# Patient Record
Sex: Female | Born: 1975 | Race: Black or African American | Hispanic: No | Smoking: Never smoker
Health system: Southern US, Community
[De-identification: ages and names within clinical notes are randomized; demographics above are authoritative.]

## PROBLEM LIST (undated history)

## (undated) DIAGNOSIS — D259 Leiomyoma of uterus, unspecified: Secondary | ICD-10-CM

## (undated) DIAGNOSIS — Z789 Other specified health status: Secondary | ICD-10-CM

## (undated) DIAGNOSIS — R011 Cardiac murmur, unspecified: Secondary | ICD-10-CM

## (undated) HISTORY — DX: Cardiac murmur, unspecified: R01.1

---

## 1998-05-06 HISTORY — PX: KNEE RECONSTRUCTION: SHX5883

## 2003-12-22 ENCOUNTER — Emergency Department (HOSPITAL_COMMUNITY): Admission: EM | Admit: 2003-12-22 | Discharge: 2003-12-22 | Payer: Self-pay | Admitting: Emergency Medicine

## 2005-05-15 ENCOUNTER — Inpatient Hospital Stay (HOSPITAL_COMMUNITY): Admission: AD | Admit: 2005-05-15 | Discharge: 2005-05-15 | Payer: Self-pay | Admitting: Obstetrics and Gynecology

## 2005-05-19 ENCOUNTER — Emergency Department (HOSPITAL_COMMUNITY): Admission: EM | Admit: 2005-05-19 | Discharge: 2005-05-19 | Payer: Self-pay | Admitting: Emergency Medicine

## 2005-05-21 ENCOUNTER — Emergency Department (HOSPITAL_COMMUNITY): Admission: EM | Admit: 2005-05-21 | Discharge: 2005-05-21 | Payer: Self-pay | Admitting: Family Medicine

## 2005-05-23 ENCOUNTER — Encounter: Admission: RE | Admit: 2005-05-23 | Discharge: 2005-05-23 | Payer: Self-pay | Admitting: Cardiovascular Disease

## 2013-04-19 ENCOUNTER — Inpatient Hospital Stay (HOSPITAL_COMMUNITY)
Admission: AD | Admit: 2013-04-19 | Discharge: 2013-04-19 | Disposition: A | Discharge status: Home / Self Care | Payer: Self-pay | Source: Ambulatory Visit | Attending: Obstetrics and Gynecology | Admitting: Obstetrics and Gynecology

## 2013-04-19 ENCOUNTER — Inpatient Hospital Stay (HOSPITAL_COMMUNITY): Payer: Self-pay

## 2013-04-19 ENCOUNTER — Encounter (HOSPITAL_COMMUNITY): Payer: Self-pay | Admitting: *Deleted

## 2013-04-19 DIAGNOSIS — R109 Unspecified abdominal pain: Secondary | ICD-10-CM | POA: Insufficient documentation

## 2013-04-19 DIAGNOSIS — D219 Benign neoplasm of connective and other soft tissue, unspecified: Secondary | ICD-10-CM

## 2013-04-19 DIAGNOSIS — D259 Leiomyoma of uterus, unspecified: Secondary | ICD-10-CM | POA: Insufficient documentation

## 2013-04-19 DIAGNOSIS — N7011 Chronic salpingitis: Secondary | ICD-10-CM

## 2013-04-19 DIAGNOSIS — N7013 Chronic salpingitis and oophoritis: Secondary | ICD-10-CM | POA: Insufficient documentation

## 2013-04-19 HISTORY — DX: Leiomyoma of uterus, unspecified: D25.9

## 2013-04-19 LAB — URINALYSIS, ROUTINE W REFLEX MICROSCOPIC
Bilirubin Urine: NEGATIVE
Glucose, UA: NEGATIVE mg/dL
Ketones, ur: NEGATIVE mg/dL
Leukocytes, UA: NEGATIVE
Nitrite: NEGATIVE
Protein, ur: NEGATIVE mg/dL
Specific Gravity, Urine: >1.03 — ABNORMAL HIGH (ref 1.005–1.030)
Urobilinogen, UA: 0.2 mg/dL (ref 0.0–1.0)
pH: 6 (ref 5.0–8.0)

## 2013-04-19 LAB — WET PREP, GENITAL: Yeast Wet Prep HPF POC: NONE SEEN

## 2013-04-19 LAB — URINE MICROSCOPIC-ADD ON

## 2013-04-19 MED ORDER — DOXYCYCLINE HYCLATE 50 MG PO CAPS: 100.0000 mg | ORAL_CAPSULE | Freq: Two times a day (BID) | ORAL | Status: DC

## 2013-04-19 MED ORDER — HYDROMORPHONE HCL PF 1 MG/ML IJ SOLN
1.0000 mg | Freq: Once | INTRAMUSCULAR | Status: AC
  Administered 2013-04-19: 1 mg via INTRAMUSCULAR
  Filled 2013-04-19: qty 1

## 2013-04-19 MED ORDER — ONDANSETRON 8 MG PO TBDP
8.0000 mg | ORAL_TABLET | Freq: Once | ORAL | Status: AC
  Administered 2013-04-19: 8 mg via ORAL
  Filled 2013-04-19: qty 1

## 2013-04-19 MED ORDER — DOXYCYCLINE HYCLATE 100 MG PO TABS
100.0000 mg | ORAL_TABLET | Freq: Once | ORAL | Status: AC
  Administered 2013-04-19: 100 mg via ORAL
  Filled 2013-04-19: qty 1

## 2013-04-19 MED ORDER — PROMETHAZINE HCL 25 MG/ML IJ SOLN
12.5000 mg | Freq: Once | INTRAMUSCULAR | Status: AC
  Administered 2013-04-19: 12.5 mg via INTRAMUSCULAR
  Filled 2013-04-19: qty 1

## 2013-04-19 MED ORDER — CEFTRIAXONE SODIUM 250 MG IJ SOLR
250.0000 mg | Freq: Once | INTRAMUSCULAR | Status: AC
  Administered 2013-04-19: 250 mg via INTRAMUSCULAR
  Filled 2013-04-19: qty 250

## 2013-04-19 MED ORDER — KETOROLAC TROMETHAMINE 60 MG/2ML IM SOLN
60.0000 mg | Freq: Once | INTRAMUSCULAR | Status: AC
  Administered 2013-04-19: 60 mg via INTRAMUSCULAR
  Filled 2013-04-19: qty 2

## 2013-04-19 NOTE — MAU Provider Note (Signed)
History     CSN: [id number]  Arrival date and time: 04/19/13 8295   None     No chief complaint on file.  HPI  Karla Wolf is a 37 y.o. who presents today with spotting and cramping. She states that she has been spotting for one week, and cramping since last night. She states that she normally has very bad cramps when she has her period, but this is much worse than normal. She rates her pain 10/10. She has tried Midol, ibuprofen  and tylenol without any improvement.   She has been a longtime patient of Dr. Cherly Hensen, but has not been to see her in about a year and a half.   No past medical history on file.  No past surgical history on file.  No family history on file.  History  Substance Use Topics  . Smoking status: Not on file  . Smokeless tobacco: Not on file  . Alcohol Use: Not on file    Allergies: Allergies not on file  No prescriptions prior to admission    ROS Physical Exam   Height 5\' 6"  (1.676 m), weight 76.204 kg (168 lb), last menstrual period 04/12/2013.  Physical Exam  Nursing note and vitals reviewed. Constitutional: She is oriented to person, place, and time. She appears well-developed and well-nourished. No distress.  Cardiovascular: Normal rate.   Respiratory: Effort normal.  GI: Soft. There is no tenderness.  Genitourinary:   External: no lesion Vagina: small amount of white discharge Cervix: pink, smooth, no CMT Uterus: slightly enlarged, Retroverted  Adnexa: some tenderness on the left     Neurological: She is alert and oriented to person, place, and time.  Skin: Skin is warm and dry.  Psychiatric: She has a normal mood and affect.    MAU Course  Procedures  Results for orders placed during the hospital encounter of 04/19/13 (from the past 24 hour(s))  URINALYSIS, ROUTINE W REFLEX MICROSCOPIC     Status: Abnormal   Collection Time    04/19/13  3:30 AM      Result Value Range   Color, Urine YELLOW  YELLOW   APPearance CLEAR   CLEAR   Specific Gravity, Urine >1.030 (*) 1.005 - 1.030   pH 6.0  5.0 - 8.0   Glucose, UA NEGATIVE  NEGATIVE mg/dL   Hgb urine dipstick SMALL (*) NEGATIVE   Bilirubin Urine NEGATIVE  NEGATIVE   Ketones, ur NEGATIVE  NEGATIVE mg/dL   Protein, ur NEGATIVE  NEGATIVE mg/dL   Urobilinogen, UA 0.2  0.0 - 1.0 mg/dL   Nitrite NEGATIVE  NEGATIVE   Leukocytes, UA NEGATIVE  NEGATIVE  URINE MICROSCOPIC-ADD ON     Status: Abnormal   Collection Time    04/19/13  3:30 AM      Result Value Range   Squamous Epithelial / LPF FEW (*) RARE   WBC, UA 0-2  <3 WBC/hpf   RBC / HPF 0-2  <3 RBC/hpf   Bacteria, UA FEW (*) RARE   Urine-Other MUCOUS PRESENT    WET PREP, GENITAL     Status: Abnormal   Collection Time    04/19/13  3:50 AM      Result Value Range   Yeast Wet Prep HPF POC NONE SEEN  NONE SEEN   Trich, Wet Prep NONE SEEN  NONE SEEN   Clue Cells Wet Prep HPF POC FEW (*) NONE SEEN   WBC, Wet Prep HPF POC FEW (*) NONE SEEN    US Transvaginal  Non-ob  04/19/2013   CLINICAL DATA:  Pelvic pain.  EXAM: TRANSABDOMINAL AND TRANSVAGINAL ULTRASOUND OF PELVIS  TECHNIQUE: Both transabdominal and transvaginal ultrasound examinations of the pelvis were performed. Transabdominal technique was performed for global imaging of the pelvis including uterus, ovaries, adnexal regions, and pelvic cul-de-sac. It was necessary to proceed with endovaginal exam following the transabdominal exam to visualize the uterus.  COMPARISON:  Pelvic sonogram 05/15/2005  FINDINGS: Uterus  The uterus is enlarged a 12 x 8 x 11 cm secondary to multiple, at least 7, heterogeneous echotexture, partly shadowing masses, consistent with fibroids. The largest fibroid is posterior and to the right, intramural, measuring 7 cm in diameter. The remaining intramural fibroids measure 3 to 5 cm in diameter. The most notable fibroid is submucosal, with extensive contact to the endometrium, approximately 3 cm in diameter.  Endometrium  Thickness: Distorted  in the mid body secondary to above submucosal fibroid. Otherwise, endometrium is thin at 6.5 mm.  Right ovary  Measurements: 2.9 x 1.7 x 1.9 cm. The ovary appears normal. There is however a 4.5 cm long, 1.8 cm diameter anechoic tubular structure in the adnexae which may represent a hydrosalpinx. No confirmatory endosalpingeal folds.  Left ovary  Measurements: 3.6 x 1.7 x 1.8 cm. Normal appearance/no adnexal mass.  Other findings  Small free fluid, simple in appearance.  IMPRESSION: 1. Numerous uterine fibroids, up to 7 cm in diameter. Notable 3 cm fibroid which is submucosal. 2. Right hydrosalpinx versus peritoneal inclusion cyst.   Electronically Signed   By: Tiburcio Pea M.D.   On: 04/19/2013 05:10   US Pelvis Complete  04/19/2013   CLINICAL DATA:  Pelvic pain.  EXAM: TRANSABDOMINAL AND TRANSVAGINAL ULTRASOUND OF PELVIS  TECHNIQUE: Both transabdominal and transvaginal ultrasound examinations of the pelvis were performed. Transabdominal technique was performed for global imaging of the pelvis including uterus, ovaries, adnexal regions, and pelvic cul-de-sac. It was necessary to proceed with endovaginal exam following the transabdominal exam to visualize the uterus.  COMPARISON:  Pelvic sonogram 05/15/2005  FINDINGS: Uterus  The uterus is enlarged a 12 x 8 x 11 cm secondary to multiple, at least 7, heterogeneous echotexture, partly shadowing masses, consistent with fibroids. The largest fibroid is posterior and to the right, intramural, measuring 7 cm in diameter. The remaining intramural fibroids measure 3 to 5 cm in diameter. The most notable fibroid is submucosal, with extensive contact to the endometrium, approximately 3 cm in diameter.  Endometrium  Thickness: Distorted in the mid body secondary to above submucosal fibroid. Otherwise, endometrium is thin at 6.5 mm.  Right ovary  Measurements: 2.9 x 1.7 x 1.9 cm. The ovary appears normal. There is however a 4.5 cm long, 1.8 cm diameter anechoic tubular  structure in the adnexae which may represent a hydrosalpinx. No confirmatory endosalpingeal folds.  Left ovary  Measurements: 3.6 x 1.7 x 1.8 cm. Normal appearance/no adnexal mass.  Other findings  Small free fluid, simple in appearance.  IMPRESSION: 1. Numerous uterine fibroids, up to 7 cm in diameter. Notable 3 cm fibroid which is submucosal. 2. Right hydrosalpinx versus peritoneal inclusion cyst.   Electronically Signed   By: Tiburcio Pea M.D.   On: 04/19/2013 05:10     Assessment and Plan   1. Fibroids   2. Hydrosalpinx     First dose of doxy given here in MAU    Medication List         doxycycline 50 MG capsule  Commonly known as:  VIBRAMYCIN  Take  2 capsules (100 mg total) by mouth 2 (two) times daily.     ibuprofen 400 MG tablet  Commonly known as:  ADVIL,MOTRIN  Take 400 mg by mouth every 6 (six) hours as needed.       Follow-up Information   Schedule an appointment as soon as possible for a visit with Serita Kyle, MD.   Specialty:  Obstetrics and Gynecology   Contact information:   [address] [place] [address] [phone]       Follow up with COUSINS,SHERONETTE A, MD In 2 weeks.   Specialty:  Obstetrics and Gynecology   Contact information:   [address] [place] [address] [phone]        Tawnya Crook 04/19/2013, 3:32 AM

## 2013-04-19 NOTE — MAU Note (Signed)
Patient complains of severe abdominal cramping that has been going on for a while but never this bad. Spotting that began 12/8.

## 2013-04-23 NOTE — MAU Provider Note (Signed)
Attestation of Attending Supervision of Advanced Practitioner: Evaluation and management procedures were performed by the PA/NP/CNM/OB Fellow under my supervision/collaboration. Chart reviewed and agree with management and plan.  Norena Bratton V 04/23/2013 8:00 PM   

## 2013-05-27 ENCOUNTER — Other Ambulatory Visit: Payer: Self-pay | Admitting: Obstetrics and Gynecology

## 2013-05-27 ENCOUNTER — Encounter (HOSPITAL_COMMUNITY): Payer: Self-pay | Admitting: Pharmacist

## 2013-05-31 ENCOUNTER — Encounter (HOSPITAL_COMMUNITY): Payer: Self-pay

## 2013-05-31 ENCOUNTER — Encounter (HOSPITAL_COMMUNITY)
Admission: RE | Admit: 2013-05-31 | Discharge: 2013-05-31 | Disposition: A | Discharge status: Home / Self Care | Payer: Managed Care, Other (non HMO) | Source: Ambulatory Visit | Attending: Obstetrics and Gynecology | Admitting: Obstetrics and Gynecology

## 2013-05-31 HISTORY — DX: Other specified health status: Z78.9

## 2013-05-31 LAB — ABO/RH: ABO/RH(D): O POS

## 2013-05-31 LAB — TYPE AND SCREEN
ABO/RH(D): O POS
Antibody Screen: NEGATIVE

## 2013-05-31 LAB — CBC
HCT: 30.5 % — ABNORMAL LOW (ref 36.0–46.0)
Hemoglobin: 9.8 g/dL — ABNORMAL LOW (ref 12.0–15.0)
MCH: 23.6 pg — ABNORMAL LOW (ref 26.0–34.0)
MCHC: 32.1 g/dL (ref 30.0–36.0)
MCV: 73.5 fL — ABNORMAL LOW (ref 78.0–100.0)
Platelets: 360 10*3/uL (ref 150–400)
RBC: 4.15 MIL/uL (ref 3.87–5.11)
RDW: 15.4 % (ref 11.5–15.5)
WBC: 6.6 10*3/uL (ref 4.0–10.5)

## 2013-05-31 NOTE — Patient Instructions (Addendum)
   Your procedure is scheduled on:  Thursday, Jan 29  Enter through the [employer] [facility] at:  [place] up the phone at the desk and dial [phone] and inform us of your arrival.  Please call this number if you have any problems the morning of surgery: [phone]  Remember: Do not eat food after midnight: Wednesday Do not drink clear liquids after: 10 AM Thursday, day of surgery Take these medicines the morning of surgery with a SIP OF WATER: None  Do not wear jewelry, make-up, or FINGER nail polish No metal in your hair or on your body. Do not wear lotions, powders, perfumes.  You may wear deodorant.  Do not bring valuables to the hospital. Contacts, dentures or bridgework may not be worn into surgery.  Leave suitcase in the car. After Surgery it may be brought to your room. For patients being admitted to the hospital, checkout time is 11:00am the day of discharge.  Home with Tere[facility] Coop cell [phone].

## 2013-06-03 ENCOUNTER — Encounter (HOSPITAL_COMMUNITY): Payer: Self-pay | Admitting: Anesthesiology

## 2013-06-03 ENCOUNTER — Inpatient Hospital Stay (HOSPITAL_COMMUNITY): Payer: Managed Care, Other (non HMO) | Admitting: Anesthesiology

## 2013-06-03 ENCOUNTER — Encounter (HOSPITAL_COMMUNITY)
Admission: RE | Disposition: A | Discharge status: Home / Self Care | Payer: Self-pay | Source: Ambulatory Visit | Attending: Obstetrics and Gynecology

## 2013-06-03 ENCOUNTER — Inpatient Hospital Stay (HOSPITAL_COMMUNITY)
Admission: RE | Admit: 2013-06-03 | Discharge: 2013-06-05 | DRG: 743 | Disposition: A | Discharge status: Home / Self Care | Payer: Managed Care, Other (non HMO) | Source: Ambulatory Visit | Attending: Obstetrics and Gynecology | Admitting: Obstetrics and Gynecology

## 2013-06-03 ENCOUNTER — Encounter (HOSPITAL_COMMUNITY): Payer: Managed Care, Other (non HMO) | Admitting: Anesthesiology

## 2013-06-03 DIAGNOSIS — D252 Subserosal leiomyoma of uterus: Secondary | ICD-10-CM | POA: Diagnosis present

## 2013-06-03 DIAGNOSIS — D509 Iron deficiency anemia, unspecified: Secondary | ICD-10-CM | POA: Diagnosis present

## 2013-06-03 DIAGNOSIS — N920 Excessive and frequent menstruation with regular cycle: Principal | ICD-10-CM | POA: Diagnosis present

## 2013-06-03 DIAGNOSIS — Z9889 Other specified postprocedural states: Secondary | ICD-10-CM

## 2013-06-03 DIAGNOSIS — N946 Dysmenorrhea, unspecified: Secondary | ICD-10-CM | POA: Diagnosis present

## 2013-06-03 DIAGNOSIS — N7013 Chronic salpingitis and oophoritis: Secondary | ICD-10-CM | POA: Diagnosis present

## 2013-06-03 HISTORY — PX: MYOMECTOMY: SHX85

## 2013-06-03 LAB — PREGNANCY, URINE: Preg Test, Ur: NEGATIVE

## 2013-06-03 SURGERY — MYOMECTOMY, ABDOMINAL APPROACH
Anesthesia: General

## 2013-06-03 MED ORDER — METHYLENE BLUE 1 % INJ SOLN
INTRAMUSCULAR | Status: AC
  Filled 2013-06-03: qty 10

## 2013-06-03 MED ORDER — KETOROLAC TROMETHAMINE 30 MG/ML IJ SOLN
INTRAMUSCULAR | Status: AC
  Filled 2013-06-03: qty 1

## 2013-06-03 MED ORDER — IBUPROFEN 800 MG PO TABS
800.0000 mg | ORAL_TABLET | Freq: Three times a day (TID) | ORAL | Status: DC | PRN
  Administered 2013-06-04: 800 mg via ORAL
  Filled 2013-06-03: qty 1

## 2013-06-03 MED ORDER — HYDROMORPHONE HCL PF 1 MG/ML IJ SOLN
INTRAMUSCULAR | Status: AC
  Filled 2013-06-03: qty 1

## 2013-06-03 MED ORDER — ONDANSETRON HCL 4 MG/2ML IJ SOLN
INTRAMUSCULAR | Status: AC
  Filled 2013-06-03: qty 2

## 2013-06-03 MED ORDER — MIDAZOLAM HCL 2 MG/2ML IJ SOLN: 0.5000 mg | Freq: Once | INTRAMUSCULAR | Status: DC | PRN

## 2013-06-03 MED ORDER — MEPERIDINE HCL 25 MG/ML IJ SOLN: 6.2500 mg | INTRAMUSCULAR | Status: DC | PRN

## 2013-06-03 MED ORDER — DEXTROSE IN LACTATED RINGERS 5 % IV SOLN
INTRAVENOUS | Status: DC
  Administered 2013-06-04: 02:00:00 via INTRAVENOUS

## 2013-06-03 MED ORDER — BUPIVACAINE HCL (PF) 0.25 % IJ SOLN
INTRAMUSCULAR | Status: AC
  Filled 2013-06-03: qty 30

## 2013-06-03 MED ORDER — HYDROMORPHONE HCL PF 1 MG/ML IJ SOLN
0.2500 mg | INTRAMUSCULAR | Status: DC | PRN
  Administered 2013-06-03 (×3): 0.25 mg via INTRAVENOUS

## 2013-06-03 MED ORDER — FLUMAZENIL 0.5 MG/5ML IV SOLN
INTRAVENOUS | Status: DC | PRN
  Administered 2013-06-03: 0.2 mg via INTRAVENOUS

## 2013-06-03 MED ORDER — CEFAZOLIN SODIUM-DEXTROSE 2-3 GM-% IV SOLR
2.0000 g | INTRAVENOUS | Status: AC
  Administered 2013-06-03: 2 g via INTRAVENOUS

## 2013-06-03 MED ORDER — VASOPRESSIN 20 UNIT/ML IJ SOLN
INTRAVENOUS | Status: DC | PRN
  Administered 2013-06-03: 16:00:00 via INTRAMUSCULAR

## 2013-06-03 MED ORDER — PANTOPRAZOLE SODIUM 40 MG PO TBEC
40.0000 mg | DELAYED_RELEASE_TABLET | Freq: Every day | ORAL | Status: DC
  Administered 2013-06-04 – 2013-06-05 (×2): 40 mg via ORAL
  Filled 2013-06-03 (×2): qty 1

## 2013-06-03 MED ORDER — BUPIVACAINE HCL (PF) 0.25 % IJ SOLN
INTRAMUSCULAR | Status: DC | PRN
  Administered 2013-06-03: 5 mL

## 2013-06-03 MED ORDER — KETOROLAC TROMETHAMINE 30 MG/ML IJ SOLN
INTRAMUSCULAR | Status: DC | PRN
  Administered 2013-06-03 (×2): 30 mg via INTRAVENOUS

## 2013-06-03 MED ORDER — PROPOFOL 10 MG/ML IV EMUL
INTRAVENOUS | Status: AC
  Filled 2013-06-03: qty 20

## 2013-06-03 MED ORDER — PROMETHAZINE HCL 25 MG/ML IJ SOLN
INTRAMUSCULAR | Status: AC
  Filled 2013-06-03: qty 1

## 2013-06-03 MED ORDER — KETOROLAC TROMETHAMINE 30 MG/ML IJ SOLN
30.0000 mg | Freq: Four times a day (QID) | INTRAMUSCULAR | Status: DC
  Administered 2013-06-03 – 2013-06-04 (×4): 30 mg via INTRAVENOUS
  Filled 2013-06-03 (×4): qty 1

## 2013-06-03 MED ORDER — DEXAMETHASONE SODIUM PHOSPHATE 10 MG/ML IJ SOLN
INTRAMUSCULAR | Status: AC
  Filled 2013-06-03: qty 1

## 2013-06-03 MED ORDER — NEOSTIGMINE METHYLSULFATE 1 MG/ML IJ SOLN
INTRAMUSCULAR | Status: AC
  Filled 2013-06-03: qty 1

## 2013-06-03 MED ORDER — GLYCOPYRROLATE 0.2 MG/ML IJ SOLN
INTRAMUSCULAR | Status: DC | PRN
  Administered 2013-06-03: 0.6 mg via INTRAVENOUS

## 2013-06-03 MED ORDER — MENTHOL 3 MG MT LOZG: 1.0000 | LOZENGE | OROMUCOSAL | Status: DC | PRN

## 2013-06-03 MED ORDER — ONDANSETRON HCL 4 MG PO TABS: 4.0000 mg | ORAL_TABLET | Freq: Four times a day (QID) | ORAL | Status: DC | PRN

## 2013-06-03 MED ORDER — SODIUM CHLORIDE 0.9 % IJ SOLN
INTRAMUSCULAR | Status: AC
  Filled 2013-06-03: qty 100

## 2013-06-03 MED ORDER — LACTATED RINGERS IV SOLN
INTRAVENOUS | Status: DC
  Administered 2013-06-03 (×3): via INTRAVENOUS

## 2013-06-03 MED ORDER — NEOSTIGMINE METHYLSULFATE 1 MG/ML IJ SOLN
INTRAMUSCULAR | Status: DC | PRN
  Administered 2013-06-03: 4 mg via INTRAVENOUS

## 2013-06-03 MED ORDER — FENTANYL CITRATE 0.05 MG/ML IJ SOLN
INTRAMUSCULAR | Status: AC
  Filled 2013-06-03: qty 5

## 2013-06-03 MED ORDER — PROPOFOL 10 MG/ML IV BOLUS
INTRAVENOUS | Status: DC | PRN
  Administered 2013-06-03: 180 mg via INTRAVENOUS

## 2013-06-03 MED ORDER — ZOLPIDEM TARTRATE 5 MG PO TABS
5.0000 mg | ORAL_TABLET | Freq: Every evening | ORAL | Status: DC | PRN
Start: 2013-06-03 — End: 2013-06-05

## 2013-06-03 MED ORDER — LIDOCAINE HCL (CARDIAC) 20 MG/ML IV SOLN
INTRAVENOUS | Status: DC | PRN
  Administered 2013-06-03: 70 mg via INTRAVENOUS
  Administered 2013-06-03: 30 mg via INTRAVENOUS

## 2013-06-03 MED ORDER — HYDROMORPHONE HCL PF 1 MG/ML IJ SOLN
INTRAMUSCULAR | Status: DC | PRN
  Administered 2013-06-03: 0.5 mg via INTRAVENOUS
  Administered 2013-06-03 (×2): .25 mg via INTRAVENOUS

## 2013-06-03 MED ORDER — LIDOCAINE HCL (CARDIAC) 20 MG/ML IV SOLN
INTRAVENOUS | Status: AC
  Filled 2013-06-03: qty 5

## 2013-06-03 MED ORDER — KETOROLAC TROMETHAMINE 30 MG/ML IJ SOLN
15.0000 mg | Freq: Once | INTRAMUSCULAR | Status: DC | PRN
Start: 2013-06-03 — End: 2013-06-03

## 2013-06-03 MED ORDER — OXYCODONE-ACETAMINOPHEN 5-325 MG PO TABS
1.0000 | ORAL_TABLET | ORAL | Status: DC | PRN
  Administered 2013-06-04 (×2): 1 via ORAL
  Filled 2013-06-03 (×2): qty 1

## 2013-06-03 MED ORDER — FENTANYL CITRATE 0.05 MG/ML IJ SOLN
INTRAMUSCULAR | Status: DC | PRN
  Administered 2013-06-03 (×5): 50 ug via INTRAVENOUS

## 2013-06-03 MED ORDER — PROMETHAZINE HCL 25 MG/ML IJ SOLN
6.2500 mg | INTRAMUSCULAR | Status: DC | PRN
  Administered 2013-06-03: 6.25 mg via INTRAVENOUS

## 2013-06-03 MED ORDER — ROCURONIUM BROMIDE 100 MG/10ML IV SOLN
INTRAVENOUS | Status: DC | PRN
  Administered 2013-06-03 (×2): 10 mg via INTRAVENOUS
  Administered 2013-06-03: 40 mg via INTRAVENOUS
  Administered 2013-06-03 (×2): 10 mg via INTRAVENOUS

## 2013-06-03 MED ORDER — ROCURONIUM BROMIDE 100 MG/10ML IV SOLN
INTRAVENOUS | Status: AC
  Filled 2013-06-03: qty 1

## 2013-06-03 MED ORDER — MIDAZOLAM HCL 2 MG/2ML IJ SOLN
INTRAMUSCULAR | Status: DC | PRN
  Administered 2013-06-03: 2 mg via INTRAVENOUS

## 2013-06-03 MED ORDER — VASOPRESSIN 20 UNIT/ML IJ SOLN
INTRAMUSCULAR | Status: AC
  Filled 2013-06-03: qty 3

## 2013-06-03 MED ORDER — POLYSACCHARIDE IRON COMPLEX 150 MG PO CAPS
150.0000 mg | ORAL_CAPSULE | Freq: Two times a day (BID) | ORAL | Status: DC
  Administered 2013-06-03 – 2013-06-05 (×4): 150 mg via ORAL
  Filled 2013-06-03 (×4): qty 1

## 2013-06-03 MED ORDER — KETOROLAC TROMETHAMINE 30 MG/ML IJ SOLN: 30.0000 mg | Freq: Four times a day (QID) | INTRAMUSCULAR | Status: DC

## 2013-06-03 MED ORDER — GLYCOPYRROLATE 0.2 MG/ML IJ SOLN
INTRAMUSCULAR | Status: AC
  Filled 2013-06-03: qty 3

## 2013-06-03 MED ORDER — HYDROMORPHONE HCL PF 1 MG/ML IJ SOLN
0.2000 mg | INTRAMUSCULAR | Status: DC | PRN
Start: 2013-06-03 — End: 2013-06-05

## 2013-06-03 MED ORDER — ONDANSETRON HCL 4 MG/2ML IJ SOLN: 4.0000 mg | Freq: Four times a day (QID) | INTRAMUSCULAR | Status: DC | PRN

## 2013-06-03 MED ORDER — DEXAMETHASONE SODIUM PHOSPHATE 10 MG/ML IJ SOLN
INTRAMUSCULAR | Status: DC | PRN
  Administered 2013-06-03: 10 mg via INTRAVENOUS

## 2013-06-03 MED ORDER — ONDANSETRON HCL 4 MG/2ML IJ SOLN
INTRAMUSCULAR | Status: DC | PRN
  Administered 2013-06-03: 4 mg via INTRAVENOUS

## 2013-06-03 MED ORDER — MIDAZOLAM HCL 2 MG/2ML IJ SOLN
INTRAMUSCULAR | Status: AC
  Filled 2013-06-03: qty 2

## 2013-06-03 SURGICAL SUPPLY — 47 items
BARRIER ADHS 3X4 INTERCEED (GAUZE/BANDAGES/DRESSINGS) ×3 IMPLANT
BRR ADH 4X3 ABS CNTRL BYND (GAUZE/BANDAGES/DRESSINGS) ×1
CANISTER SUCT 3000ML (MISCELLANEOUS) ×3 IMPLANT
CATH FOLEY 2WAY  3CC  8FR (CATHETERS)
CATH FOLEY 2WAY 3CC 8FR (CATHETERS) IMPLANT
CLOTH BEACON ORANGE TIMEOUT ST (SAFETY) ×3 IMPLANT
CONT PATH 16OZ SNAP LID 3702 (MISCELLANEOUS) ×3 IMPLANT
CONT SPEC PATH 64OZ SNAP LID (MISCELLANEOUS) ×3 IMPLANT
DECANTER SPIKE VIAL GLASS SM (MISCELLANEOUS) ×3 IMPLANT
DRAPE CESAREAN BIRTH W POUCH (DRAPES) ×3 IMPLANT
DRSG OPSITE POSTOP 4X10 (GAUZE/BANDAGES/DRESSINGS) ×3 IMPLANT
ELECT NEEDLE TIP 2.8 STRL (NEEDLE) ×3 IMPLANT
FILTER STRAW FLUID ASPIR (MISCELLANEOUS) IMPLANT
GAUZE SPONGE 4X4 16PLY XRAY LF (GAUZE/BANDAGES/DRESSINGS) ×3 IMPLANT
GLOVE BIO SURGEON STRL SZ 6.5 (GLOVE) ×3 IMPLANT
GLOVE BIOGEL PI IND STRL 7.0 (GLOVE) ×2 IMPLANT
GLOVE BIOGEL PI INDICATOR 7.0 (GLOVE) ×1
GLOVE ECLIPSE 6.5 STRL STRAW (GLOVE) ×3 IMPLANT
GOWN STRL REUS W/TWL LRG LVL3 (GOWN DISPOSABLE) ×9 IMPLANT
IV STOPCOCK 4 WAY 40  W/Y SET (IV SOLUTION)
IV STOPCOCK 4 WAY 40 W/Y SET (IV SOLUTION) IMPLANT
NEEDLE HYPO 25X1 1.5 SAFETY (NEEDLE) ×3 IMPLANT
NEEDLE SPNL 22GX3.5 QUINCKE BK (NEEDLE) ×3 IMPLANT
NEEDLE SPNL 22GX9.0 ACCUTG (NEEDLE) ×3 IMPLANT
NS IRRIG 1000ML POUR BTL (IV SOLUTION) ×3 IMPLANT
PACK ABDOMINAL GYN (CUSTOM PROCEDURE TRAY) ×3 IMPLANT
PAD OB MATERNITY 4.3X12.25 (PERSONAL CARE ITEMS) ×3 IMPLANT
SPONGE GAUZE 4X4 12PLY STER LF (GAUZE/BANDAGES/DRESSINGS) ×3 IMPLANT
STAPLER VISISTAT 35W (STAPLE) IMPLANT
SUT MNCRL AB 4-0 PS2 18 (SUTURE) IMPLANT
SUT MNCRL+ AB 3-0 CT1 36 (SUTURE) ×2 IMPLANT
SUT MON AB 3-0 SH 27 (SUTURE) ×15
SUT MON AB 3-0 SH27 (SUTURE) ×10 IMPLANT
SUT MONOCRYL AB 3-0 CT1 36IN (SUTURE) ×1
SUT VIC AB 0 CT1 18XCR BRD8 (SUTURE) ×6 IMPLANT
SUT VIC AB 0 CT1 36 (SUTURE) ×12 IMPLANT
SUT VIC AB 0 CT1 8-18 (SUTURE) ×9
SUT VIC AB 2-0 CT1 27 (SUTURE) ×2
SUT VIC AB 2-0 CT1 TAPERPNT 27 (SUTURE) ×2 IMPLANT
SUT VIC AB 2-0 SH 27 (SUTURE) ×3
SUT VIC AB 2-0 SH 27XBRD (SUTURE) ×2 IMPLANT
SUT VIC AB 4-0 PS2 27 (SUTURE) IMPLANT
SYR 50ML LL SCALE MARK (SYRINGE) IMPLANT
SYR CONTROL 10ML LL (SYRINGE) ×6 IMPLANT
TOWEL OR 17X24 6PK STRL BLUE (TOWEL DISPOSABLE) ×6 IMPLANT
TRAY FOLEY CATH 14FR (SET/KITS/TRAYS/PACK) ×3 IMPLANT
WATER STERILE IRR 1000ML POUR (IV SOLUTION) ×3 IMPLANT

## 2013-06-03 NOTE — Transfer of Care (Signed)
Immediate Anesthesia Transfer of Care Note  Patient: Karla Wolf  Procedure(s) Performed: Procedure(s): Exploratory Laparotomy MYOMECTOMY (N/A)  Patient Location: PACU  Anesthesia Type:General  Level of Consciousness: awake, sedated and patient cooperative  Airway & Oxygen Therapy: Patient Spontanous Breathing and Patient connected to nasal cannula oxygen  Post-op Assessment: Report given to PACU RN and Post -op Vital signs reviewed and stable  Post vital signs: Reviewed and stable  Complications: No apparent anesthesia complications

## 2013-06-03 NOTE — Brief Op Note (Signed)
06/03/2013  5:37 PM  PATIENT:  Karla Wolf  38 y.o. female  PRE-OPERATIVE DIAGNOSIS:  Menometrorrhagia, Uterine Fibroid, Possible Hydrosalpinx  POST-OPERATIVE DIAGNOSIS:  Menometrorrhagia, Uterine Fibroid, bilateral  Hydrosalpinx  PROCEDURE:  Procedure(s): Exploratory Laparotomy MYOMECTOMY (N/A)  SURGEON:  Surgeon(s) and Role:    * Maruice Pieroni Clint Bolder, MD - Primary  PHYSICIAN ASSISTANT:   ASSISTANTS: Julianne Handler, CNM   ANESTHESIA:   general  EBL:  Total I/O In: 1700 [I.V.:1700] Out: 570 [Urine:420; Blood:150]  BLOOD ADMINISTERED:none  DRAINS: none   LOCAL MEDICATIONS USED:  MARCAINE     SPECIMEN:  Source of Specimen:  myoma x18  DISPOSITION OF SPECIMEN:  PATHOLOGY  COUNTS:  YES  TOURNIQUET:  * No tourniquets in log *  DICTATION: .Other Dictation: Dictation Number [id number]  PLAN OF CARE: Admit to inpatient   PATIENT DISPOSITION:  PACU - hemodynamically stable.   Delay start of Pharmacological VTE agent (>24hrs) due to surgical blood loss or risk of bleeding: no

## 2013-06-03 NOTE — Anesthesia Preprocedure Evaluation (Signed)
Anesthesia Evaluation  Patient identified by MRN, date of birth, ID band Patient awake    Reviewed: Allergy & Precautions, H&P , Patient's Chart, lab work & pertinent test results, reviewed documented beta blocker date and time   History of Anesthesia Complications Negative for: history of anesthetic complications  Airway Mallampati: II  TM Distance: >3 FB Neck ROM: full    Dental   Pulmonary  breath sounds clear to auscultation        Cardiovascular Exercise Tolerance: Good Rhythm:regular Rate:Normal     Neuro/Psych    GI/Hepatic   Endo/Other    Renal/GU      Musculoskeletal   Abdominal   Peds  Hematology   Anesthesia Other Findings   Reproductive/Obstetrics                             Anesthesia Physical Anesthesia Plan  ASA: II  Anesthesia Plan: General ETT   Post-op Pain Management:    Induction:   Airway Management Planned:   Additional Equipment:   Intra-op Plan:   Post-operative Plan:   Informed Consent: I have reviewed the patients History and Physical, chart, labs and discussed the procedure including the risks, benefits and alternatives for the proposed anesthesia with the patient or authorized representative who has indicated his/her understanding and acceptance.   Dental Advisory Given  Plan Discussed with: CRNA and Surgeon  Anesthesia Plan Comments:         Anesthesia Quick Evaluation  

## 2013-06-03 NOTE — Anesthesia Postprocedure Evaluation (Signed)
  Anesthesia Post Note  Patient: Karla Wolf  Procedure(s) Performed: Procedure(s) (LRB): Exploratory Laparotomy MYOMECTOMY (N/A)  Anesthesia type: GA  Patient location: PACU  Post pain: Pain level controlled  Post assessment: Post-op Vital signs reviewed  Last Vitals:  Filed Vitals:   06/03/13 1830  BP: 128/71  Pulse: 59  Temp:   Resp: 14    Post vital signs: Reviewed  Level of consciousness: sedated  Complications: No apparent anesthesia complications

## 2013-06-04 ENCOUNTER — Encounter (HOSPITAL_COMMUNITY): Payer: Self-pay | Admitting: Obstetrics and Gynecology

## 2013-06-04 LAB — CBC
HEMATOCRIT: 27.2 % — AB (ref 36.0–46.0)
Hemoglobin: 8.8 g/dL — ABNORMAL LOW (ref 12.0–15.0)
MCH: 23.4 pg — ABNORMAL LOW (ref 26.0–34.0)
MCHC: 32.4 g/dL (ref 30.0–36.0)
MCV: 72.3 fL — ABNORMAL LOW (ref 78.0–100.0)
Platelets: 302 10*3/uL (ref 150–400)
RBC: 3.76 MIL/uL — ABNORMAL LOW (ref 3.87–5.11)
RDW: 15.2 % (ref 11.5–15.5)
WBC: 18.1 10*3/uL — ABNORMAL HIGH (ref 4.0–10.5)

## 2013-06-04 NOTE — Progress Notes (Signed)
Subjective: Patient reports tolerating PO and no problems voiding.   Denies pain  Objective: I have reviewed patient's vital signs.  vital signs, intake and output and labs. Filed Vitals:   06/04/13 0533  BP: 143/80  Pulse: 66  Temp: 97.9 F (36.6 C)  Resp: 18   I/O last 3 completed shifts: In: 4527.3 [P.O.:1180; I.V.:3347.3] Out: 2445 [Urine:2295; Blood:150]    Lab Results  Component Value Date   WBC 18.1* 06/04/2013   HGB 8.8* 06/04/2013   HCT 27.2* 06/04/2013   MCV 72.3* 06/04/2013   PLT 302 06/04/2013   No results found for this basename: CREATININE    EXAM General: alert, cooperative and no distress Resp: clear to auscultation bilaterally Cardio: regular rate and rhythm, S1, S2 normal, no murmur, click, rub or gallop GI: soft, non-tender; bowel sounds normal; no masses,  no organomegaly, incision: clean, dry and intact and slightly distended Extremities: no edema, redness or tenderness in the calves or thighs Vaginal Bleeding: minimal Reviewed intraoperative findings with pt including issue of both tubes Assessment: s/p Procedure(s): Exploratory Laparotomy MYOMECTOMY: stable, progressing well, tolerating diet and anemia  Plan: Advance diet Encourage ambulation Advance to PO medication Discontinue IV fluids Start iron supplement  LOS: 1 day    Ashunti Schofield A, MD 06/04/2013 8:53 AM    06/04/2013, 8:53 AM

## 2013-06-05 MED ORDER — IBUPROFEN 800 MG PO TABS: 800.0000 mg | ORAL_TABLET | Freq: Three times a day (TID) | ORAL | Status: DC | PRN

## 2013-06-05 MED ORDER — POLYSACCHARIDE IRON COMPLEX 150 MG PO CAPS: 150.0000 mg | ORAL_CAPSULE | Freq: Two times a day (BID) | ORAL | Status: DC

## 2013-06-05 MED ORDER — OXYCODONE-ACETAMINOPHEN 5-325 MG PO TABS: 1.0000 | ORAL_TABLET | ORAL | Status: DC | PRN

## 2013-06-05 MED ORDER — BISACODYL 10 MG RE SUPP
10.0000 mg | Freq: Once | RECTAL | Status: AC
  Administered 2013-06-05: 10 mg via RECTAL
  Filled 2013-06-05: qty 1

## 2013-06-05 NOTE — Progress Notes (Signed)
Subjective: Patient reports tolerating PO, + flatus and no problems voiding.    Objective: I have reviewed patient's vital signs.  vital signs, intake and output and medications. Filed Vitals:   06/05/13 0506  BP: 108/67  Pulse: 71  Temp: 98.1 F (36.7 C)  Resp: 16   I/O last 3 completed shifts: In: 2527.3 [P.O.:1180; I.V.:1347.3] Out: 2675 [Urine:2675]    Lab Results  Component Value Date   WBC 18.1* 06/04/2013   HGB 8.8* 06/04/2013   HCT 27.2* 06/04/2013   MCV 72.3* 06/04/2013   PLT 302 06/04/2013   No results found for this basename: CREATININE    EXAM General: alert, cooperative and no distress Resp: clear to auscultation bilaterally Cardio: regular rate and rhythm, S1, S2 normal, no murmur, click, rub or gallop GI: soft, non-tender; bowel sounds normal; no masses,  no organomegaly and incision: clean, dry and intact Extremities: no edema, redness or tenderness in the calves or thighs Vaginal Bleeding: minimal  Assessment: s/p Procedure(s): Exploratory Laparotomy MYOMECTOMY: stable, progressing well, tolerating diet and anemia  Plan: Advance diet Encourage ambulation Advance to PO medication Discharge home D/c instructions reviewed. Percocet, motrin, niferex D/c staples(Tuesday)  LOS: 2 days    Mindel Friscia A, MD 06/05/2013 11:19 AM    06/05/2013, 11:19 AM

## 2013-06-05 NOTE — Discharge Instructions (Signed)
Call if temperature greater than equal to 100.4, nothing per vagina for 4-6 weeks or severe nausea vomiting, increased incisional pain , drainage or redness in the incision site, no straining with bowel movements, showers no bath °

## 2013-06-05 NOTE — Progress Notes (Signed)
Pt d/c home with family, ambulatory, stable to private car. D/C instructions and prescriptions reviewed with pt. Pt verbalized understanding. No further questions at this time. Pt has a f/u appt on 2-6 with Dr. Garwin Brothers.

## 2013-06-05 NOTE — Discharge Summary (Signed)
Physician Discharge Summary  Patient ID: Karla Wolf MRN: [id number] DOB/AGE: 38-Sep-1977 38 y.o.  Admit date: 06/03/2013 Discharge date: 06/05/2013  Admission Diagnoses: menorrhagia, dysmenorrhea, uterine fibroids,  Iron deficiency anemia  Discharge Diagnoses: menorrahagia, dysmenorrhea, uterine fibroids, iron deficiency anemia, bilateral hydrosalpinx. LOA Active Problems:   S/P myomectomy   Discharged Condition: stable  Hospital Course: Pt was admitted  To [facility]. She underwent exploratory laparotomy., multiple myomectomy. Postop course unremarkable  Consults: None  Significant Diagnostic Studies: labs:  CBC    Component Value Date/Time   WBC 18.1* 06/04/2013 0515   RBC 3.76* 06/04/2013 0515   HGB 8.8* 06/04/2013 0515   HCT 27.2* 06/04/2013 0515   PLT 302 06/04/2013 0515   MCV 72.3* 06/04/2013 0515   MCH 23.4* 06/04/2013 0515   MCHC 32.4 06/04/2013 0515   RDW 15.2 06/04/2013 0515      Treatments: surgery: exploratory laparotomy, multiple myomectomy  Discharge Exam: Blood pressure 108/67, pulse 71, temperature 98.1 F (36.7 C), temperature source Oral, resp. rate 16, height 5\' 6"  (1.676 m), weight 76.658 kg (169 lb), SpO2 100.00%. General appearance: alert, cooperative and no distress Resp: clear to auscultation bilaterally Cardio: regular rate and rhythm, S1, S2 normal, no murmur, click, rub or gallop GI: soft, non-tender; bowel sounds normal; no masses,  no organomegaly and incision: dressing d/c/intact Pelvic: deferred Extremities: no edema, redness or tenderness in the calves or thighs Pad: minimal bleeding Disposition: 01-Home or Self Care  Discharge Orders   Future Orders Complete By Expires   Diet general  As directed    Discharge instructions  As directed    Comments:     Call if temperature greater than equal to 100.4, nothing per vagina for 4-6 weeks or severe nausea vomiting, increased incisional pain , drainage or redness in the incision site, no straining with  bowel movements, showers no bath   Discharge patient  As directed    Discharge wound care:  As directed    Comments:     Call office for appt to removal staples tuesday   May walk up steps  As directed        Medication List    STOP taking these medications       HYDROcodone-acetaminophen 5-325 MG per tablet  Commonly known as:  NORCO/VICODIN      TAKE these medications       ibuprofen 800 MG tablet  Commonly known as:  ADVIL,MOTRIN  Take 1 tablet (800 mg total) by mouth every 8 (eight) hours as needed (mild pain).     iron polysaccharides 150 MG capsule  Commonly known as:  NIFEREX  Take 1 capsule (150 mg total) by mouth 2 (two) times daily.     oxyCODONE-acetaminophen 5-325 MG per tablet  Commonly known as:  PERCOCET/ROXICET  Take 1-2 tablets by mouth every 4 (four) hours as needed for severe pain (moderate to severe pain (when tolerating fluids)).           Follow-up Information   Follow up with Marvene Staff, MD. (staple removal tuesday)    Specialty:  Obstetrics and Gynecology   Contact information:   [address] [place] [address] [phone]       Signed: Servando Salina A 06/05/2013, 11:30 AM

## 2013-06-07 NOTE — Op Note (Signed)
NAME:  Karla Wolf, Karla Wolf                    ACCOUNT NO.:  MEDICAL RECORD NO.:  [id number]  LOCATION:                                 FACILITY:  PHYSICIAN:  Servando Salina, M.D.DATE OF BIRTH:  03-05-1976  DATE OF PROCEDURE:  06/02/2013 DATE OF DISCHARGE:                              OPERATIVE REPORT   PREOPERATIVE DIAGNOSES:  Menometrorrhagia, uterine fibroids, possible left hydrosalpinx.  PROCEDURE: exploratory laparotomy, multiple myomectomy, lysis of adhesions.  POSTOPERATIVE DIAGNOSES:  Menometrorrhagia, bilateral hydrosalpinx, uterine fibroids.  ANESTHESIA:  General.  SURGEON:  Servando Salina, MD  ASSISTANT:  Julianne Handler, CNM.  ANESTHESIA:  General.  DESCRIPTION OF PROCEDURE:  Under adequate general anesthesia, the patient was placed in the supine position.  She was sterilely prepped and draped in usual fashion.  Indwelling Foley catheter was sterilely placed.  A 0.25% Marcaine was injected on the planned Pfannenstiel skin incision.  Pfannenstiel skin incision was then made, carried down to the rectus fascia.  Rectus fascia was opened transversely.  Rectus fascia was then bluntly and sharply dissected off the rectus muscle in superior and inferior fashion.  The rectus muscle was split in midline.  The parietal peritoneum was entered sharply and extended.  On entry, the upper abdomen was palpated, normal liver edge.  No masses could be appreciated.  The uterus was notable for multiple fibroids.  The left adnexa was initially visualized with adhesions, particularly surrounding the left ovary and distal distention of the fallopian tube.  Those adhesions were lysed.  A dilute solution of Pitressin was injected in the posterior fundal fibroid that was noted.  Vertical incision posteriorly was made.  Several fibroids were removed.  Anteriorly, there was prominence of 3 fibroids, overlying serosa was injected with Pitressin and again the same procedure was done with  multiple fibroids were removed, one of which had caused the integrity of the endometrial cavity to be opened.  With palpations, multiple fibroids was removed from throughout different locations of the uterus.  A small subserosal fibroids were removed.  There was a fibroid that was at the proximal portion of the left fallopian tube which was not removed due to concern to compromise in the tube in that location with opening for fibrotic. The uterus was palpated throughout for any smaller fibroids, some fibroids were very early in their inception.  Once that was done, the right tube and ovary was noted.  The right tube was elongated, fluid filled with old blood with no identifiably fimbriated end.  It was surrounding the right ovary.  The adhesions were lysed around that ovary freeing up the tube ultimately which would cause incidental rupture of the tube with drainage of the fluid, but no identifiable ends to perform a neosalpingostomy was possible.  The tube was therefore remained. The defects throughout the uterus was closed with 0 Vicryl figure-of- eight sutures in the deep layer followed by 3-0 Monocryl sutures for the subcuticular stitches and serosal surfaces.  Once this was done, the abdomen was copiously irrigated and suctioned.  There was bleeding on the left ovary, required figure-of-eight suture, good hemostasis noted. The left tube was inspected.  It was somewhat tortuous, slightly  retracted and shortened, decision made, given the findings on the contralateral side to not pursue removal of both tubes due to desired future fertility and exact infertility coverage unknown.  Once the hemostasis was achieved, Interceed was placed anteriorly and posteriorly while the uterus was placed back in the abdomen, from which it has been exteriorized.  The pelvis was then closed with 2-0 Vicryl for the peritoneum, fascia was closed with 0 Vicryl x2.  Subcutaneous area was irrigated, small  bleeders cauterized. Interrupted 2-0 plain sutures placed and the skin approximated with Ethicon staples.    Specimen was myoma x18 sent to Pathology.  ESTIMATED BLOOD LOSS:  150 mL.  URINE OUTPUT:  425 mL.  INTRAOPERATIVE FLUIDS:  1700 mL.  Sponge and instrument counts x2 was correct.  COMPLICATION:  None.  The patient tolerated procedure well, was transferred to recovery in stable condition.     Servando Salina, M.D.     [place]/MEDQ  D:  06/03/2013  T:  06/04/2013  Job:  [id number]

## 2013-06-08 SURGERY — Surgical Case
Anesthesia: *Unknown

## 2013-10-28 ENCOUNTER — Encounter (HOSPITAL_COMMUNITY): Payer: Self-pay | Admitting: Emergency Medicine

## 2013-10-28 ENCOUNTER — Emergency Department (HOSPITAL_COMMUNITY)
Admission: EM | Admit: 2013-10-28 | Discharge: 2013-10-28 | Disposition: A | Discharge status: Home / Self Care | Payer: Managed Care, Other (non HMO) | Attending: Emergency Medicine | Admitting: Emergency Medicine

## 2013-10-28 DIAGNOSIS — Z8742 Personal history of other diseases of the female genital tract: Secondary | ICD-10-CM | POA: Insufficient documentation

## 2013-10-28 DIAGNOSIS — S139XXA Sprain of joints and ligaments of unspecified parts of neck, initial encounter: Secondary | ICD-10-CM | POA: Insufficient documentation

## 2013-10-28 DIAGNOSIS — Y9389 Activity, other specified: Secondary | ICD-10-CM | POA: Insufficient documentation

## 2013-10-28 DIAGNOSIS — Z79899 Other long term (current) drug therapy: Secondary | ICD-10-CM | POA: Insufficient documentation

## 2013-10-28 DIAGNOSIS — S0993XA Unspecified injury of face, initial encounter: Secondary | ICD-10-CM | POA: Diagnosis present

## 2013-10-28 DIAGNOSIS — Z91199 Patient's noncompliance with other medical treatment and regimen due to unspecified reason: Secondary | ICD-10-CM | POA: Insufficient documentation

## 2013-10-28 DIAGNOSIS — S161XXA Strain of muscle, fascia and tendon at neck level, initial encounter: Secondary | ICD-10-CM

## 2013-10-28 DIAGNOSIS — Y92410 Unspecified street and highway as the place of occurrence of the external cause: Secondary | ICD-10-CM | POA: Insufficient documentation

## 2013-10-28 DIAGNOSIS — Z9119 Patient's noncompliance with other medical treatment and regimen: Secondary | ICD-10-CM | POA: Diagnosis not present

## 2013-10-28 DIAGNOSIS — S199XXA Unspecified injury of neck, initial encounter: Secondary | ICD-10-CM | POA: Diagnosis present

## 2013-10-28 MED ORDER — IBUPROFEN 800 MG PO TABS: 800.0000 mg | ORAL_TABLET | Freq: Three times a day (TID) | ORAL | Status: DC

## 2013-10-28 MED ORDER — CYCLOBENZAPRINE HCL 10 MG PO TABS: 10.0000 mg | ORAL_TABLET | Freq: Two times a day (BID) | ORAL | Status: DC | PRN
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
## 2013-10-28 NOTE — Discharge Instructions (Signed)
Cryotherapy Cryotherapy means treatment with cold. Ice or gel packs can be used to reduce both pain and swelling. Ice is the most helpful within the first 24 to 48 hours after an injury or flareup from overusing a muscle or joint. Sprains, strains, spasms, burning pain, shooting pain, and aches can all be eased with ice. Ice can also be used when recovering from surgery. Ice is effective, has very few side effects, and is safe for most people to use. PRECAUTIONS  Ice is not a safe treatment option for people with:  Raynaud's phenomenon. This is a condition affecting small blood vessels in the extremities. Exposure to cold may cause your problems to return.  Cold hypersensitivity. There are many forms of cold hypersensitivity, including:  Cold urticaria. Red, itchy hives appear on the skin when the tissues begin to warm after being iced.  Cold erythema. This is a red, itchy rash caused by exposure to cold.  Cold hemoglobinuria. Red blood cells break down when the tissues begin to warm after being iced. The hemoglobin that carry oxygen are passed into the urine because they cannot combine with blood proteins fast enough.  Numbness or altered sensitivity in the area being iced. If you have any of the following conditions, do not use ice until you have discussed cryotherapy with your caregiver:  Heart conditions, such as arrhythmia, angina, or chronic heart disease.  High blood pressure.  Healing wounds or open skin in the area being iced.  Current infections.  Rheumatoid arthritis.  Poor circulation.  Diabetes. Ice slows the blood flow in the region it is applied. This is beneficial when trying to stop inflamed tissues from spreading irritating chemicals to surrounding tissues. However, if you expose your skin to cold temperatures for too long or without the proper protection, you can damage your skin or nerves. Watch for signs of skin damage due to cold. HOME CARE INSTRUCTIONS Follow  these tips to use ice and cold packs safely.  Place a dry or damp towel between the ice and skin. A damp towel will cool the skin more quickly, so you may need to shorten the time that the ice is used.  For a more rapid response, add gentle compression to the ice.  Ice for no more than 10 to 20 minutes at a time. The bonier the area you are icing, the less time it will take to get the benefits of ice.  Check your skin after 5 minutes to make sure there are no signs of a poor response to cold or skin damage.  Rest 20 minutes or more in between uses.  Once your skin is numb, you can end your treatment. You can test numbness by very lightly touching your skin. The touch should be so light that you do not see the skin dimple from the pressure of your fingertip. When using ice, most people will feel these normal sensations in this order: cold, burning, aching, and numbness.  Do not use ice on someone who cannot communicate their responses to pain, such as small children or people with dementia. HOW TO MAKE AN ICE PACK Ice packs are the most common way to use ice therapy. Other methods include ice massage, ice baths, and cryo-sprays. Muscle creams that cause a cold, tingly feeling do not offer the same benefits that ice offers and should not be used as a substitute unless recommended by your caregiver. To make an ice pack, do one of the following:  Place crushed ice or  a bag of frozen vegetables in a sealable plastic bag. Squeeze out the excess air. Place this bag inside another plastic bag. Slide the bag into a pillowcase or place a damp towel between your skin and the bag.  Mix 3 parts water with 1 part rubbing alcohol. Freeze the mixture in a sealable plastic bag. When you remove the mixture from the freezer, it will be slushy. Squeeze out the excess air. Place this bag inside another plastic bag. Slide the bag into a pillowcase or place a damp towel between your skin and the bag. SEEK MEDICAL  CARE IF:  You develop white spots on your skin. This may give the skin a blotchy (mottled) appearance.  Your skin turns blue or pale.  Your skin becomes waxy or hard.  Your swelling gets worse. MAKE SURE YOU:   Understand these instructions.  Will watch your condition.  Will get help right away if you are not doing well or get worse. Document Released: 12/17/2010 Document Revised: 07/15/2011 Document Reviewed: 12/17/2010 [facility] Patient Information 2015 [place], [place]. This information is not intended to replace advice given to you by your health care provider. Make sure you discuss any questions you have with your health care provider.  Cervical Sprain A cervical sprain is an injury in the neck in which the strong, fibrous tissues (ligaments) that connect your neck bones stretch or tear. Cervical sprains can range from mild to severe. Severe cervical sprains can cause the neck vertebrae to be unstable. This can lead to damage of the spinal cord and can result in serious nervous system problems. The amount of time it takes for a cervical sprain to get better depends on the cause and extent of the injury. Most cervical sprains heal in 1 to 3 weeks. CAUSES  Severe cervical sprains may be caused by:   Contact sport injuries (such as from football, rugby, wrestling, hockey, auto racing, gymnastics, diving, martial arts, or boxing).   Motor vehicle collisions.   Whiplash injuries. This is an injury from a sudden forward and backward whipping movement of the head and neck.  Falls.  Mild cervical sprains may be caused by:   Being in an awkward position, such as while cradling a telephone between your ear and shoulder.   Sitting in a chair that does not offer proper support.   Working at a poorly Landscape architect station.   Looking up or down for long periods of time.  SYMPTOMS   Pain, soreness, stiffness, or a burning sensation in the front, back, or sides of the neck.  This discomfort may develop immediately after the injury or slowly, 24 hours or more after the injury.   Pain or tenderness directly in the middle of the back of the neck.   Shoulder or upper back pain.   Limited ability to move the neck.   Headache.   Dizziness.   Weakness, numbness, or tingling in the hands or arms.   Muscle spasms.   Difficulty swallowing or chewing.   Tenderness and swelling of the neck.  DIAGNOSIS  Most of the time your health care provider can diagnose a cervical sprain by taking your history and doing a physical exam. Your health care provider will ask about previous neck injuries and any known neck problems, such as arthritis in the neck. X-rays may be taken to find out if there are any other problems, such as with the bones of the neck. Other tests, such as a CT scan or MRI, may  also be needed.  TREATMENT  Treatment depends on the severity of the cervical sprain. Mild sprains can be treated with rest, keeping the neck in place (immobilization), and pain medicines. Severe cervical sprains are immediately immobilized. Further treatment is done to help with pain, muscle spasms, and other symptoms and may include:  Medicines, such as pain relievers, numbing medicines, or muscle relaxants.   Physical therapy. This may involve stretching exercises, strengthening exercises, and posture training. Exercises and improved posture can help stabilize the neck, strengthen muscles, and help stop symptoms from returning.  HOME CARE INSTRUCTIONS   Put ice on the injured area.   Put ice in a plastic bag.   Place a towel between your skin and the bag.   Leave the ice on for 15-20 minutes, 3-4 times a day.   If your injury was severe, you may have been given a cervical collar to wear. A cervical collar is a two-piece collar designed to keep your neck from moving while it heals.  Do not remove the collar unless instructed by your health care provider.  If  you have long hair, keep it outside of the collar.  Ask your health care provider before making any adjustments to your collar. Minor adjustments may be required over time to improve comfort and reduce pressure on your chin or on the back of your head.  Ifyou are allowed to remove the collar for cleaning or bathing, follow your health care provider's instructions on how to do so safely.  Keep your collar clean by wiping it with mild soap and water and drying it completely. If the collar you have been given includes removable pads, remove them every 1-2 days and hand wash them with soap and water. Allow them to air dry. They should be completely dry before you wear them in the collar.  If you are allowed to remove the collar for cleaning and bathing, wash and dry the skin of your neck. Check your skin for irritation or sores. If you see any, tell your health care provider.  Do not drive while wearing the collar.   Only take over-the-counter or prescription medicines for pain, discomfort, or fever as directed by your health care provider.   Keep all follow-up appointments as directed by your health care provider.   Keep all physical therapy appointments as directed by your health care provider.   Make any needed adjustments to your workstation to promote good posture.   Avoid positions and activities that make your symptoms worse.   Warm up and stretch before being active to help prevent problems.  SEEK MEDICAL CARE IF:   Your pain is not controlled with medicine.   You are unable to decrease your pain medicine over time as planned.   Your activity level is not improving as expected.  SEEK IMMEDIATE MEDICAL CARE IF:   You develop any bleeding.  You develop stomach upset.  You have signs of an allergic reaction to your medicine.   Your symptoms get worse.   You develop new, unexplained symptoms.   You have numbness, tingling, weakness, or paralysis in any part of  your body.  MAKE SURE YOU:   Understand these instructions.  Will watch your condition.  Will get help right away if you are not doing well or get worse. Document Released: 02/17/2007 Document Revised: 04/27/2013 Document Reviewed: 10/28/2012 [facility] Patient Information 2015 [place], [place]. This information is not intended to replace advice given to you by your health care provider.  Make sure you discuss any questions you have with your health care provider. Motor Vehicle Collision  It is common to have multiple bruises and sore muscles after a motor vehicle collision (MVC). These tend to feel worse for the first 24 hours. You may have the most stiffness and soreness over the first several hours. You may also feel worse when you wake up the first morning after your collision. After this point, you will usually begin to improve with each day. The speed of improvement often depends on the severity of the collision, the number of injuries, and the location and nature of these injuries. HOME CARE INSTRUCTIONS   Put ice on the injured area.  Put ice in a plastic bag.  Place a towel between your skin and the bag.  Leave the ice on for 15-20 minutes, 3-4 times a day, or as directed by your health care provider.  Drink enough fluids to keep your urine clear or pale yellow. Do not drink alcohol.  Take a warm shower or bath once or twice a day. This will increase blood flow to sore muscles.  You may return to activities as directed by your caregiver. Be careful when lifting, as this may aggravate neck or back pain.  Only take over-the-counter or prescription medicines for pain, discomfort, or fever as directed by your caregiver. Do not use aspirin. This may increase bruising and bleeding. SEEK IMMEDIATE MEDICAL CARE IF:  You have numbness, tingling, or weakness in the arms or legs.  You develop severe headaches not relieved with medicine.  You have severe neck pain, especially tenderness  in the middle of the back of your neck.  You have changes in bowel or bladder control.  There is increasing pain in any area of the body.  You have shortness of breath, lightheadedness, dizziness, or fainting.  You have chest pain.  You feel sick to your stomach (nauseous), throw up (vomit), or sweat.  You have increasing abdominal discomfort.  There is blood in your urine, stool, or vomit.  You have pain in your shoulder (shoulder strap areas).  You feel your symptoms are getting worse. MAKE SURE YOU:   Understand these instructions.  Will watch your condition.  Will get help right away if you are not doing well or get worse. Document Released: 04/22/2005 Document Revised: 04/27/2013 Document Reviewed: 09/19/2010 [facility] Patient Information 2015 [place], [place]. This information is not intended to replace advice given to you by your health care provider. Make sure you discuss any questions you have with your health care provider.

## 2013-10-29 NOTE — ED Provider Notes (Signed)
Medical screening examination/treatment/procedure(s) were performed by non-physician practitioner and as supervising physician I was immediately available for consultation/collaboration.   EKG Interpretation None       Nat Christen, MD 10/29/13 [phone]

## 2013-11-02 ENCOUNTER — Emergency Department (HOSPITAL_BASED_OUTPATIENT_CLINIC_OR_DEPARTMENT_OTHER)
Admission: EM | Admit: 2013-11-02 | Discharge: 2013-11-02 | Disposition: A | Discharge status: Home / Self Care | Payer: Managed Care, Other (non HMO) | Attending: Emergency Medicine | Admitting: Emergency Medicine

## 2013-11-02 ENCOUNTER — Emergency Department (HOSPITAL_BASED_OUTPATIENT_CLINIC_OR_DEPARTMENT_OTHER): Payer: Managed Care, Other (non HMO)

## 2013-11-02 ENCOUNTER — Encounter (HOSPITAL_BASED_OUTPATIENT_CLINIC_OR_DEPARTMENT_OTHER): Payer: Self-pay | Admitting: Emergency Medicine

## 2013-11-02 DIAGNOSIS — R209 Unspecified disturbances of skin sensation: Secondary | ICD-10-CM | POA: Insufficient documentation

## 2013-11-02 DIAGNOSIS — Z8742 Personal history of other diseases of the female genital tract: Secondary | ICD-10-CM | POA: Insufficient documentation

## 2013-11-02 DIAGNOSIS — M542 Cervicalgia: Secondary | ICD-10-CM | POA: Insufficient documentation

## 2013-11-02 DIAGNOSIS — H539 Unspecified visual disturbance: Secondary | ICD-10-CM | POA: Insufficient documentation

## 2013-11-02 DIAGNOSIS — Z79899 Other long term (current) drug therapy: Secondary | ICD-10-CM | POA: Insufficient documentation

## 2013-11-02 DIAGNOSIS — R51 Headache: Secondary | ICD-10-CM | POA: Insufficient documentation

## 2013-11-02 DIAGNOSIS — R110 Nausea: Secondary | ICD-10-CM | POA: Insufficient documentation

## 2013-11-02 DIAGNOSIS — R5383 Other fatigue: Secondary | ICD-10-CM

## 2013-11-02 DIAGNOSIS — S0990XS Unspecified injury of head, sequela: Secondary | ICD-10-CM

## 2013-11-02 DIAGNOSIS — R5381 Other malaise: Secondary | ICD-10-CM | POA: Insufficient documentation

## 2013-11-02 DIAGNOSIS — S161XXD Strain of muscle, fascia and tendon at neck level, subsequent encounter: Secondary | ICD-10-CM

## 2013-11-02 DIAGNOSIS — G8911 Acute pain due to trauma: Secondary | ICD-10-CM | POA: Insufficient documentation

## 2013-11-02 MED ORDER — TRAMADOL HCL 50 MG PO TABS: 50.0000 mg | ORAL_TABLET | Freq: Four times a day (QID) | ORAL | Status: DC | PRN

## 2013-11-02 MED ORDER — NAPROXEN 500 MG PO TABS: 500.0000 mg | ORAL_TABLET | Freq: Two times a day (BID) | ORAL | Status: DC

## 2013-11-02 NOTE — Discharge Instructions (Signed)
Make plans to follow up with your regular doctor to clear the symptoms do not resolve. Return for any new or worse symptoms. Septic and Motrin and given tried Naprosyn and supplement with tramadol as needed. Work note provided.

## 2013-11-02 NOTE — ED Notes (Signed)
Patient transported to CT 
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
## 2014-03-07 ENCOUNTER — Encounter (HOSPITAL_BASED_OUTPATIENT_CLINIC_OR_DEPARTMENT_OTHER): Payer: Self-pay | Admitting: Emergency Medicine

## 2015-02-19 ENCOUNTER — Emergency Department (HOSPITAL_COMMUNITY)
Admission: EM | Admit: 2015-02-19 | Discharge: 2015-02-19 | Disposition: A | Discharge status: Home / Self Care | Payer: Managed Care, Other (non HMO) | Attending: Emergency Medicine | Admitting: Emergency Medicine

## 2015-02-19 ENCOUNTER — Emergency Department (HOSPITAL_COMMUNITY): Payer: Managed Care, Other (non HMO)

## 2015-02-19 ENCOUNTER — Encounter (HOSPITAL_COMMUNITY): Payer: Self-pay | Admitting: *Deleted

## 2015-02-19 DIAGNOSIS — Z86018 Personal history of other benign neoplasm: Secondary | ICD-10-CM | POA: Insufficient documentation

## 2015-02-19 DIAGNOSIS — N739 Female pelvic inflammatory disease, unspecified: Secondary | ICD-10-CM | POA: Diagnosis not present

## 2015-02-19 DIAGNOSIS — Z3202 Encounter for pregnancy test, result negative: Secondary | ICD-10-CM | POA: Diagnosis not present

## 2015-02-19 DIAGNOSIS — R1030 Lower abdominal pain, unspecified: Secondary | ICD-10-CM | POA: Diagnosis present

## 2015-02-19 DIAGNOSIS — Z792 Long term (current) use of antibiotics: Secondary | ICD-10-CM | POA: Diagnosis not present

## 2015-02-19 DIAGNOSIS — N730 Acute parametritis and pelvic cellulitis: Secondary | ICD-10-CM

## 2015-02-19 LAB — COMPREHENSIVE METABOLIC PANEL
ALK PHOS: 66 U/L (ref 38–126)
ALT: 20 U/L (ref 14–54)
ANION GAP: 9 (ref 5–15)
AST: 19 U/L (ref 15–41)
Albumin: 3.6 g/dL (ref 3.5–5.0)
BILIRUBIN TOTAL: 1.2 mg/dL (ref 0.3–1.2)
BUN: 12 mg/dL (ref 6–20)
CO2: 24 mmol/L (ref 22–32)
Calcium: 8.8 mg/dL — ABNORMAL LOW (ref 8.9–10.3)
Chloride: 101 mmol/L (ref 101–111)
Creatinine, Ser: 1.14 mg/dL — ABNORMAL HIGH (ref 0.44–1.00)
Glucose, Bld: 93 mg/dL (ref 65–99)
POTASSIUM: 3.5 mmol/L (ref 3.5–5.1)
Sodium: 134 mmol/L — ABNORMAL LOW (ref 135–145)
TOTAL PROTEIN: 7.4 g/dL (ref 6.5–8.1)

## 2015-02-19 LAB — CBC
HEMATOCRIT: 38.6 % (ref 36.0–46.0)
HEMOGLOBIN: 12.6 g/dL (ref 12.0–15.0)
MCH: 26.4 pg (ref 26.0–34.0)
MCHC: 32.6 g/dL (ref 30.0–36.0)
MCV: 80.8 fL (ref 78.0–100.0)
Platelets: 272 10*3/uL (ref 150–400)
RBC: 4.78 MIL/uL (ref 3.87–5.11)
RDW: 14 % (ref 11.5–15.5)
WBC: 22.5 10*3/uL — AB (ref 4.0–10.5)

## 2015-02-19 LAB — URINALYSIS, ROUTINE W REFLEX MICROSCOPIC
Glucose, UA: NEGATIVE mg/dL
Ketones, ur: 15 mg/dL — AB
Leukocytes, UA: NEGATIVE
Nitrite: NEGATIVE
Protein, ur: NEGATIVE mg/dL
Specific Gravity, Urine: 1.024 (ref 1.005–1.030)
UROBILINOGEN UA: 1 mg/dL (ref 0.0–1.0)
pH: 5.5 (ref 5.0–8.0)

## 2015-02-19 LAB — LIPASE, BLOOD: Lipase: 20 U/L — ABNORMAL LOW (ref 22–51)

## 2015-02-19 LAB — URINE MICROSCOPIC-ADD ON

## 2015-02-19 LAB — I-STAT BETA HCG BLOOD, ED (MC, WL, AP ONLY): I-stat hCG, quantitative: <5 m[IU]/mL (ref <5)

## 2015-02-19 LAB — WET PREP, GENITAL
Trich, Wet Prep: NONE SEEN
Yeast Wet Prep HPF POC: NONE SEEN

## 2015-02-19 MED ORDER — DOXYCYCLINE HYCLATE 100 MG PO CAPS: 100.0000 mg | ORAL_CAPSULE | Freq: Two times a day (BID) | ORAL | Status: DC

## 2015-02-19 MED ORDER — IOHEXOL 300 MG/ML  SOLN
100.0000 mL | Freq: Once | INTRAMUSCULAR | Status: AC | PRN
  Administered 2015-02-19: 80 mL via INTRAVENOUS

## 2015-02-19 MED ORDER — CEFTRIAXONE SODIUM 250 MG IJ SOLR
250.0000 mg | Freq: Once | INTRAMUSCULAR | Status: AC
  Administered 2015-02-19: 250 mg via INTRAMUSCULAR
  Filled 2015-02-19: qty 250

## 2015-02-19 MED ORDER — LIDOCAINE HCL (PF) 1 % IJ SOLN
INTRAMUSCULAR | Status: AC
  Administered 2015-02-19: 5 mL
  Filled 2015-02-19: qty 5

## 2015-02-19 MED ORDER — METRONIDAZOLE 500 MG PO TABS: 500.0000 mg | ORAL_TABLET | Freq: Two times a day (BID) | ORAL | Status: DC

## 2015-02-19 NOTE — ED Notes (Signed)
Pt reports RLQ pain intermitent for extended amount of time, this episode has been occuring x 2 weeks with mild nausea, denies vomiting or diarrhea.

## 2015-02-19 NOTE — Discharge Instructions (Signed)
Pelvic Inflammatory Disease °Pelvic inflammatory disease (PID) refers to an infection in some or all of the female organs. The infection can be in the uterus, ovaries, fallopian tubes, or the surrounding tissues in the pelvis. PID can cause abdominal or pelvic pain that comes on suddenly (acute pelvic pain). PID is a serious infection because it can lead to lasting (chronic) pelvic pain or the inability to have children (infertility). °CAUSES °This condition is most often caused by an infection that is spread during sexual contact. However, the infection can also be caused by the normal bacteria that are found in the vaginal tissues if these bacteria travel upward into the reproductive organs. PID can also occur following: °· The birth of a baby. °· A miscarriage. °· An abortion. °· Major pelvic surgery. °· The use of an intrauterine device (IUD). °· A sexual assault. °RISK FACTORS °This condition is more likely to develop in women who: °· Are younger than 39 years of age. °· Are sexually active at a young age. °· Use nonbarrier contraception. °· Have multiple sexual partners. °· Have sex with someone who has symptoms of an STD (sexually transmitted disease). °· Use oral contraception. °At times, certain behaviors can also increase the possibility of getting PID, such as: °· Using a vaginal douche. °· Having an IUD in place. °SYMPTOMS °Symptoms of this condition include: °· Abdominal or pelvic pain. °· Fever. °· Chills. °· Abnormal vaginal discharge. °· Abnormal uterine bleeding. °· Unusual pain shortly after the end of a menstrual period. °· Painful urination. °· Pain with sexual intercourse. °· Nausea and vomiting. °DIAGNOSIS °To diagnose this condition, your health care provider will do a physical exam and take your medical history. A pelvic exam typically reveals great tenderness in the uterus and the surrounding pelvic tissues. You may also have tests, such as: °· Lab tests, including a pregnancy test, blood  tests, and urine test. °· Culture tests of the vagina and cervix to check for an STD. °· Ultrasound. °· A laparoscopic procedure to look inside the pelvis. °· Examining vaginal secretions under a microscope. °TREATMENT °Treatment for this condition may involve one or more approaches. °· Antibiotic medicines may be prescribed to be taken by mouth. °· Sexual partners may need to be treated if the infection is caused by an STD. °· For more severe cases, hospitalization may be needed to give antibiotics directly into a vein through an IV tube. °· Surgery may be needed if other treatments do not help, but this is rare. °It may take weeks until you are completely well. If you are diagnosed with PID, you should also be checked for human immunodeficiency virus (HIV). Your health care provider may test you for infection again 3 months after treatment. You should not have unprotected sex. °HOME CARE INSTRUCTIONS °· Take over-the-counter and prescription medicines only as told by your health care provider. °· If you were prescribed an antibiotic medicine, take it as told by your health care provider. Do not stop taking the antibiotic even if you start to feel better. °· Do not have sexual intercourse until treatment is completed or as told by your health care provider. If PID is confirmed, your recent sexual partners will need treatment, especially if you had unprotected sex. °· Keep all follow-up visits as told by your health care provider. This is important. °SEEK MEDICAL CARE IF: °· You have increased or abnormal vaginal discharge. °· Your pain does not improve. °· You vomit. °· You have a fever. °· You   cannot tolerate your medicines.  Your partner has an STD.  You have pain when you urinate. SEEK IMMEDIATE MEDICAL CARE IF:  You have increased abdominal or pelvic pain.  You have chills.  Your symptoms are not better in 72 hours even with treatment.   This information is not intended to replace advice given to  you by your health care provider. Make sure you discuss any questions you have with your health care provider.   Document Released: 04/22/2005 Document Revised: 01/11/2015 Document Reviewed: 05/30/2014 Elsevier Interactive Patient Education 2016 [employer].  Please read attached information. If you experience any new or worsening signs or symptoms please return to the emergency room for evaluation. Please follow-up with your primary care provider or specialist as discussed. Please use medication prescribed only as directed and discontinue taking if you have any concerning signs or symptoms.

## 2015-02-19 NOTE — ED Provider Notes (Signed)
CSN: [id number]     Arrival date & time 02/19/15  1159 History   First MD Initiated Contact with Patient 02/19/15 1224     Chief Complaint  Patient presents with  . Abdominal Pain    HPI   39 year old female presents today with abdominal pain. Patient reports that over the last week to week she's had lower abdominal pain concentrated on the right quadrant. She reports this pain has not resolved with over-the-counter medications. She reports that she's had this pain since she was a child, it is only intermittent; this is the first time the pain has presented and not resolved. Patient reports that yesterday she had a fever yesterday and went to urgent care and was diagnosed with otitis media. Results taking 2 doses of the Augmentin prescribed. She reports no fever today, continued abdominal pain, worse in the right lower quadrant, worse with palpation. Patient has a history of uterine fibroids, she reports very minimal pain with this, this does not feel similar to fibroids. Patient reports normal bowel movements.   Past Medical History  Diagnosis Date  . Uterine fibroid   . Medical history non-contributory   . SVD (spontaneous vaginal delivery)     x 2   Past Surgical History  Procedure Laterality Date  . Knee reconstruction  2000    right knee  . Myomectomy N/A 06/03/2013    Procedure: Exploratory Laparotomy MYOMECTOMY;  Surgeon: Marvene Staff, MD;  Location: [facility];  Service: Gynecology;  Laterality: N/A;   Family History  Problem Relation Age of Onset  . Hypertension Father   . Hypertension Sister   . Diabetes Sister   . Diabetes Paternal Grandmother    Social History  Substance Use Topics  . Smoking status: Never Smoker   . Smokeless tobacco: Never Used  . Alcohol Use: Yes     Comment: social 2/month   OB History    Gravida Para Term Preterm AB TAB SAB Ectopic Multiple Living   1 1 1       1      Review of Systems  All other systems reviewed and are  negative.  Allergies  Review of patient's allergies indicates no known allergies.  Home Medications   Prior to Admission medications   Medication Sig Start Date End Date Taking? Authorizing Provider  amoxicillin-clavulanate (AUGMENTIN) 875-125 MG tablet Take 1 tablet by mouth 2 (two) times daily.   Yes Historical Provider, MD  cyclobenzaprine (FLEXERIL) 10 MG tablet Take 1 tablet (10 mg total) by mouth 2 (two) times daily as needed for muscle spasms. Patient not taking: Reported on 02/19/2015 10/28/13   Charlann Lange, PA-C  doxycycline (VIBRAMYCIN) 100 MG capsule Take 1 capsule (100 mg total) by mouth 2 (two) times daily. 02/19/15   Okey Regal, PA-C  ibuprofen (ADVIL,MOTRIN) 800 MG tablet Take 1 tablet (800 mg total) by mouth 3 (three) times daily. Patient not taking: Reported on 02/19/2015 10/28/13   Charlann Lange, PA-C  metroNIDAZOLE (FLAGYL) 500 MG tablet Take 1 tablet (500 mg total) by mouth 2 (two) times daily. 02/19/15   Okey Regal, PA-C  naproxen (NAPROSYN) 500 MG tablet Take 1 tablet (500 mg total) by mouth 2 (two) times daily. Patient not taking: Reported on 02/19/2015 11/02/13   Fredia Sorrow, MD  traMADol (ULTRAM) 50 MG tablet Take 1 tablet (50 mg total) by mouth every 6 (six) hours as needed. Patient not taking: Reported on 02/19/2015 11/02/13   Fredia Sorrow, MD   BP 117/70 mmHg  Pulse 66  Temp(Src) 99.3 F (37.4 C) (Oral)  Resp 16  Ht 5\' 6"  (1.676 m)  Wt 158 lb 3 oz (71.753 kg)  BMI 25.54 kg/m2  SpO2 98%  LMP 02/14/2015   Physical Exam  Constitutional: She is oriented to person, place, and time. She appears well-developed and well-nourished.  HENT:  Head: Normocephalic and atraumatic.  Eyes: Conjunctivae are normal. Pupils are equal, round, and reactive to light. Right eye exhibits no discharge. Left eye exhibits no discharge. No scleral icterus.  Neck: Normal range of motion. No JVD present. No tracheal deviation present.  Pulmonary/Chest: Effort normal.  No stridor.  Abdominal: Soft. Bowel sounds are normal. She exhibits no mass. There is tenderness. There is no rebound and no guarding.  Bilateral lower quadrant pain, more severe on the right, no rebound, guarding, distention.  Genitourinary: Uterus normal. Cervix exhibits motion tenderness. Cervix exhibits no discharge and no friability. Right adnexum displays no mass. Left adnexum displays no mass. No bleeding in the vagina. Vaginal discharge found.  Very minimal white sticky discharge. Minimal cervical motion tenderness  Neurological: She is alert and oriented to person, place, and time. Coordination normal.  Psychiatric: She has a normal mood and affect. Her behavior is normal. Judgment and thought content normal.  Nursing note and vitals reviewed.   ED Course  Procedures (including critical care time) Labs Review Labs Reviewed  WET PREP, GENITAL - Abnormal; Notable for the following:    Clue Cells Wet Prep HPF POC FEW (*)    WBC, Wet Prep HPF POC MODERATE (*)    All other components within normal limits  LIPASE, BLOOD - Abnormal; Notable for the following:    Lipase 20 (*)    All other components within normal limits  COMPREHENSIVE METABOLIC PANEL - Abnormal; Notable for the following:    Sodium 134 (*)    Creatinine, Ser 1.14 (*)    Calcium 8.8 (*)    All other components within normal limits  CBC - Abnormal; Notable for the following:    WBC 22.5 (*)    All other components within normal limits  URINALYSIS, ROUTINE W REFLEX MICROSCOPIC (NOT AT [facility]) - Abnormal; Notable for the following:    Color, Urine AMBER (*)    Hgb urine dipstick LARGE (*)    Bilirubin Urine SMALL (*)    Ketones, ur 15 (*)    All other components within normal limits  URINE MICROSCOPIC-ADD ON  RPR  HIV ANTIBODY (ROUTINE TESTING)  I-STAT BETA HCG BLOOD, ED ([facility], [facility], [facility] ONLY)  GC/CHLAMYDIA PROBE AMP ([place]) NOT AT [facility]    Imaging Review Ct Abdomen Pelvis W Contrast  02/19/2015  CLINICAL  DATA:  Intermittent midline epigastric and umbilical pain with nausea for 2-3 weeks. Elevated white blood cell count. EXAM: CT ABDOMEN AND PELVIS WITH CONTRAST TECHNIQUE: Multidetector CT imaging of the abdomen and pelvis was performed using the standard protocol following bolus administration of intravenous contrast. CONTRAST:  80 mL OMNIPAQUE IOHEXOL 300 MG/ML  SOLN COMPARISON:  None. FINDINGS: The patient has small bilateral pleural effusions. There is no pericardial effusion. Minimal compressive atelectasis is seen in the lung bases. The gallbladder, liver, spleen, adrenal glands, pancreas and kidneys are unremarkable. The stomach, small and large bowel and appendix appear normal. There is a small volume of free pelvic fluid which is greater than typically seen in physiologic change. The uterus is unremarkable. The fallopian tubes are dilated and fluid-filled bilaterally. The ovaries are not definitely visualized. No  lymphadenopathy is seen. There is no abscess. No focal bony abnormality is identified. IMPRESSION: Findings highly suspicious for pelvic inflammatory disease with bilateral hydro-pyo-salpinges with a small amount of free fluid. No drainable collection. If the patient does not respond appropriately to antibiotic therapy, ultrasound could be used for further evaluation. No further imaging is indicated at this time. Small bilateral pleural effusions. Electronically Signed   By: Inge Rise M.D.   On: 02/19/2015 15:48   I have personally reviewed and evaluated these images and lab results as part of my medical decision-making.   EKG Interpretation None      MDM   Final diagnoses:  PID (acute pelvic inflammatory disease)    Labs: Wet prep, urinalysis, urine microscopic, i-STAT beta-hCG, lipase, CMP, CBC- few clue cells, moderate to the BEC, large hemoglobin. Elevated creatinine at 1.14 no previous for comparison  Imaging: CT abdomen and pelvis with contrast show findings suspicious  for PID, see full results above  Consults:  Therapeutics: Ceftriaxone  Discharge Meds: Doxycycline, metronidazole  Assessment/Plan: Patient presents with lower abdominal pain, CT scan shows suspected PID with hydro-Pio salpinges. Patient is in no acute distress during my evaluation, nontoxic, vital signs are reassuring, tolerating by mouth without difficulty. She is a candidate for oral antibiotic therapy at home, she is given strict return precautions, encouraged to follow up with her OB/GYN in 3 days for reevaluation. Patient understood and agreed to the plan of follow-up, and that she would follow-up immediately if new or worsening signs or symptoms presented. Patient sent has a elevated creatinine today, this is likely due to dehydration based on her specific gravity, she is encouraged to drink plenty fluids, follow-up with her primary care.          Okey Regal, PA-C 02/19/15 1733  Pattricia Boss, MD 02/20/15 [phone]

## 2015-02-19 NOTE — ED Notes (Signed)
Pt ambulated to the bathroom to give urine sample.

## 2015-02-20 LAB — GC/CHLAMYDIA PROBE AMP (~~LOC~~) NOT AT ARMC
Chlamydia: NEGATIVE
Neisseria Gonorrhea: NEGATIVE

## 2015-02-20 LAB — HIV ANTIBODY (ROUTINE TESTING W REFLEX): HIV Screen 4th Generation wRfx: NONREACTIVE

## 2015-02-20 LAB — RPR: RPR Ser Ql: NONREACTIVE

## 2015-02-22 ENCOUNTER — Telehealth (HOSPITAL_BASED_OUTPATIENT_CLINIC_OR_DEPARTMENT_OTHER): Payer: Self-pay | Admitting: Emergency Medicine

## 2016-08-05 ENCOUNTER — Emergency Department (HOSPITAL_COMMUNITY)
Admission: EM | Admit: 2016-08-05 | Discharge: 2016-08-06 | Disposition: A | Discharge status: Home / Self Care | Payer: BLUE CROSS/BLUE SHIELD | Attending: Emergency Medicine | Admitting: Emergency Medicine

## 2016-08-05 ENCOUNTER — Emergency Department (HOSPITAL_COMMUNITY): Payer: BLUE CROSS/BLUE SHIELD

## 2016-08-05 ENCOUNTER — Encounter (HOSPITAL_COMMUNITY): Payer: Self-pay | Admitting: Emergency Medicine

## 2016-08-05 DIAGNOSIS — R0602 Shortness of breath: Secondary | ICD-10-CM | POA: Diagnosis not present

## 2016-08-05 DIAGNOSIS — R05 Cough: Secondary | ICD-10-CM | POA: Diagnosis not present

## 2016-08-05 DIAGNOSIS — J069 Acute upper respiratory infection, unspecified: Secondary | ICD-10-CM | POA: Diagnosis not present

## 2016-08-05 LAB — URINALYSIS, ROUTINE W REFLEX MICROSCOPIC
Bilirubin Urine: NEGATIVE
Glucose, UA: NEGATIVE mg/dL
Hgb urine dipstick: NEGATIVE
KETONES UR: NEGATIVE mg/dL
LEUKOCYTES UA: NEGATIVE
NITRITE: NEGATIVE
PROTEIN: NEGATIVE mg/dL
Specific Gravity, Urine: 1.027 (ref 1.005–1.030)
pH: 5 (ref 5.0–8.0)

## 2016-08-05 LAB — BASIC METABOLIC PANEL
Anion gap: 6 (ref 5–15)
BUN: 16 mg/dL (ref 6–20)
CO2: 25 mmol/L (ref 22–32)
CREATININE: 0.93 mg/dL (ref 0.44–1.00)
Calcium: 8.9 mg/dL (ref 8.9–10.3)
Chloride: 106 mmol/L (ref 101–111)
Glucose, Bld: 92 mg/dL (ref 65–99)
Potassium: 3.9 mmol/L (ref 3.5–5.1)
Sodium: 137 mmol/L (ref 135–145)

## 2016-08-05 LAB — CBC
HCT: 38.3 % (ref 36.0–46.0)
Hemoglobin: 12.7 g/dL (ref 12.0–15.0)
MCH: 26.6 pg (ref 26.0–34.0)
MCHC: 33.2 g/dL (ref 30.0–36.0)
MCV: 80.3 fL (ref 78.0–100.0)
PLATELETS: 286 10*3/uL (ref 150–400)
RBC: 4.77 MIL/uL (ref 3.87–5.11)
RDW: 14.2 % (ref 11.5–15.5)
WBC: 9.8 10*3/uL (ref 4.0–10.5)

## 2016-08-05 NOTE — ED Triage Notes (Signed)
Pt c/o dizziness, SOB, body aches worse in shoulders and back onset this morning. Reports coughing and hoarseness x 3 days. No vision changes, slurred speech, facial droop.

## 2016-08-05 NOTE — ED Provider Notes (Signed)
[facility] DEPT Provider Note   CSN: [id number] Arrival date & time: 08/05/16  1701     History   Chief Complaint Chief Complaint  Patient presents with  . Dizziness  . Generalized Body Aches    HPI Karla Wolf is a 41 y.o. female.  41 year old female presents with several day history of URI symptoms consisting of myalgias, hoarse voice, nonreactive cough. She's had some dizziness and dyspnea. Denies any fever or chills. No vomiting or diarrhea. Did have a scratchy throat. Symptoms have been persistent and no treatment use prior to arrival. Nothing makes them better or worse.      Past Medical History:  Diagnosis Date  . Medical history non-contributory   . SVD (spontaneous vaginal delivery)    x 2  . Uterine fibroid     Patient Active Problem List   Diagnosis Date Noted  . S/P myomectomy 06/03/2013    Past Surgical History:  Procedure Laterality Date  . KNEE RECONSTRUCTION  2000   right knee  . MYOMECTOMY N/A 06/03/2013   Procedure: Exploratory Laparotomy MYOMECTOMY;  Surgeon: Marvene Staff, MD;  Location: [facility];  Service: Gynecology;  Laterality: N/A;    OB History    Gravida Para Term Preterm AB Living   1 1 1     1    SAB TAB Ectopic Multiple Live Births           1       Home Medications    Prior to Admission medications   Medication Sig Start Date End Date Taking? Authorizing Provider  cyclobenzaprine (FLEXERIL) 10 MG tablet Take 1 tablet (10 mg total) by mouth 2 (two) times daily as needed for muscle spasms. Patient not taking: Reported on 02/19/2015 10/28/13   Karla Lange, PA-C  doxycycline (VIBRAMYCIN) 100 MG capsule Take 1 capsule (100 mg total) by mouth 2 (two) times daily. Patient not taking: Reported on 08/05/2016 02/19/15   Karla Regal, PA-C  ibuprofen (ADVIL,MOTRIN) 800 MG tablet Take 1 tablet (800 mg total) by mouth 3 (three) times daily. Patient not taking: Reported on 02/19/2015 10/28/13   Karla Lange, PA-C    metroNIDAZOLE (FLAGYL) 500 MG tablet Take 1 tablet (500 mg total) by mouth 2 (two) times daily. Patient not taking: Reported on 08/05/2016 02/19/15   Karla Regal, PA-C  naproxen (NAPROSYN) 500 MG tablet Take 1 tablet (500 mg total) by mouth 2 (two) times daily. Patient not taking: Reported on 02/19/2015 11/02/13   Karla Sorrow, MD  traMADol (ULTRAM) 50 MG tablet Take 1 tablet (50 mg total) by mouth every 6 (six) hours as needed. Patient not taking: Reported on 02/19/2015 11/02/13   Karla Sorrow, MD    Family History Family History  Problem Relation Age of Onset  . Hypertension Father   . Hypertension Sister   . Diabetes Sister   . Diabetes Paternal Grandmother     Social History Social History  Substance Use Topics  . Smoking status: Never Smoker  . Smokeless tobacco: Never Used  . Alcohol use Yes     Comment: social 2/month     Allergies   Patient has no known allergies.   Review of Systems Review of Systems  All other systems reviewed and are negative.    Physical Exam Updated Vital Signs BP (!) 169/92 (BP Location: Left Arm)   Pulse (!) 54   Temp 98.1 F (36.7 C) (Oral)   Resp 12   SpO2 100%   Physical Exam  Constitutional: She is  oriented to person, place, and time. She appears well-developed and well-nourished.  Non-toxic appearance. No distress.  HENT:  Head: Normocephalic and atraumatic.  Eyes: Conjunctivae, EOM and lids are normal. Pupils are equal, round, and reactive to light.  Neck: Normal range of motion. Neck supple. No tracheal deviation present. No thyroid mass present.  Cardiovascular: Normal rate, regular rhythm and normal heart sounds.  Exam reveals no gallop.   No murmur heard. Pulmonary/Chest: Effort normal and breath sounds normal. No stridor. No respiratory distress. She has no decreased breath sounds. She has no wheezes. She has no rhonchi. She has no rales.  Abdominal: Soft. Normal appearance and bowel sounds are normal. She  exhibits no distension. There is no tenderness. There is no rebound and no CVA tenderness.  Musculoskeletal: Normal range of motion. She exhibits no edema or tenderness.  Neurological: She is alert and oriented to person, place, and time. She has normal strength. No cranial nerve deficit or sensory deficit. GCS eye subscore is 4. GCS verbal subscore is 5. GCS motor subscore is 6.  Skin: Skin is warm and dry. No abrasion and no rash noted.  Psychiatric: She has a normal mood and affect. Her speech is normal and behavior is normal.  Nursing note and vitals reviewed.    ED Treatments / Results  Labs (all labs ordered are listed, but only abnormal results are displayed) Labs Reviewed  BASIC METABOLIC PANEL  CBC  URINALYSIS, ROUTINE W REFLEX MICROSCOPIC    EKG  EKG Interpretation None       Radiology No results found.  Procedures Procedures (including critical care time)  Medications Ordered in ED Medications - No data to display   Initial Impression / Assessment and Plan / ED Course  I have reviewed the triage vital signs and the nursing notes.  Pertinent labs & imaging results that were available during my care of the patient were reviewed by me and considered in my medical decision making (see chart for details).    Chest x-rays negative. Suspect URI. Stable for discharge  Final Clinical Impressions(s) / ED Diagnoses   Final diagnoses:  SOB (shortness of breath)    New Prescriptions New Prescriptions   No medications on file     Karla Leigh, MD 08/05/16 2346

## 2016-08-06 NOTE — ED Notes (Signed)
Patient ambulatory out of department prior to signing out for discharge

## 2016-09-12 ENCOUNTER — Encounter (HOSPITAL_COMMUNITY): Payer: Self-pay | Admitting: Emergency Medicine

## 2016-09-12 ENCOUNTER — Emergency Department (HOSPITAL_COMMUNITY)
Admission: EM | Admit: 2016-09-12 | Discharge: 2016-09-12 | Disposition: A | Discharge status: Home / Self Care | Payer: BLUE CROSS/BLUE SHIELD | Attending: Emergency Medicine | Admitting: Emergency Medicine

## 2016-09-12 DIAGNOSIS — F329 Major depressive disorder, single episode, unspecified: Secondary | ICD-10-CM | POA: Diagnosis not present

## 2016-09-12 DIAGNOSIS — Z79899 Other long term (current) drug therapy: Secondary | ICD-10-CM | POA: Insufficient documentation

## 2016-09-12 DIAGNOSIS — F32A Depression, unspecified: Secondary | ICD-10-CM

## 2016-09-12 NOTE — ED Provider Notes (Signed)
[facility] DEPT Provider Note   CSN: [id number] Arrival date & time: 09/12/16  1242  By signing my name below, I, Ethelle Lyon Long, attest that this documentation has been prepared under the direction and in the presence of Domenic Moras, PA-C. Electronically Signed: Ethelle Lyon Long, Scribe. 09/12/2016. 1:23 PM.  History   Chief Complaint Chief Complaint  Patient presents with  . Depression   The history is provided by the patient and medical records. No language interpreter was used.   HPI Comments:  Karla Wolf is a 41 y.o. female with a PMHx of Uterine Fibroid, who presents to the Emergency Department complaining of persistent depression over the past two months. Pt reports over the past two months she has experienced persistent, gradually worsening feelings of depression. These feelings originally arose three years ago following some life changes including a divorce and beginning to care for her ill mother and loss of her job. She states following these events she began living "a risky type of lifestyle" that she has since turned away from. Over the last two months, she has been able to speak to her friend about these feelings but not her family as she is seen "as the strong, stable head" of the family. She states going to church and exercising helps with these thoughts though she has not been able to work out for the past three months d/t a previous knee injury. She states she minimally consumes EtOH, denying EtOH addiction or illicit drug use. Denies HI/SI or self injury. Denies hallucinations, visual or auditory. She has not been Dx'd or experienced these feelings of depression prior to the original onset of these symptoms. Pt currently has a PCP but has not spoken to them about these thoughts yet. She notes no h/o anxiety or anxiety attacks. Her appetite, PO intake, weight, and sleep patterns have remained normal throughout this time. Pt denies current bodily pain and any other  complaints at this time. LMP~ last month.   Past Medical History:  Diagnosis Date  . Medical history non-contributory   . SVD (spontaneous vaginal delivery)    x 2  . Uterine fibroid    Patient Active Problem List   Diagnosis Date Noted  . S/P myomectomy 06/03/2013   Past Surgical History:  Procedure Laterality Date  . KNEE RECONSTRUCTION  2000   right knee  . MYOMECTOMY N/A 06/03/2013   Procedure: Exploratory Laparotomy MYOMECTOMY;  Surgeon: Marvene Staff, MD;  Location: [facility];  Service: Gynecology;  Laterality: N/A;   OB History    Gravida Para Term Preterm AB Living   1 1 1     1    SAB TAB Ectopic Multiple Live Births           1     Home Medications    Prior to Admission medications   Medication Sig Start Date End Date Taking? Authorizing Provider  cyclobenzaprine (FLEXERIL) 10 MG tablet Take 1 tablet (10 mg total) by mouth 2 (two) times daily as needed for muscle spasms. Patient not taking: Reported on 02/19/2015 10/28/13   Charlann Lange, PA-C  doxycycline (VIBRAMYCIN) 100 MG capsule Take 1 capsule (100 mg total) by mouth 2 (two) times daily. Patient not taking: Reported on 08/05/2016 02/19/15   Hedges, Dellis Filbert, PA-C  ibuprofen (ADVIL,MOTRIN) 800 MG tablet Take 1 tablet (800 mg total) by mouth 3 (three) times daily. Patient not taking: Reported on 02/19/2015 10/28/13   Charlann Lange, PA-C  metroNIDAZOLE (FLAGYL) 500 MG tablet Take 1 tablet (  500 mg total) by mouth 2 (two) times daily. Patient not taking: Reported on 08/05/2016 02/19/15   Hedges, Dellis Filbert, PA-C  naproxen (NAPROSYN) 500 MG tablet Take 1 tablet (500 mg total) by mouth 2 (two) times daily. Patient not taking: Reported on 02/19/2015 11/02/13   Fredia Sorrow, MD  traMADol (ULTRAM) 50 MG tablet Take 1 tablet (50 mg total) by mouth every 6 (six) hours as needed. Patient not taking: Reported on 02/19/2015 11/02/13   Fredia Sorrow, MD   Family History Family History  Problem Relation Age of Onset  .  Hypertension Father   . Hypertension Sister   . Diabetes Sister   . Diabetes Paternal Grandmother     Social History Social History  Substance Use Topics  . Smoking status: Never Smoker  . Smokeless tobacco: Never Used  . Alcohol use Yes     Comment: social 2/month   Allergies   Patient has no known allergies.   Review of Systems Review of Systems  Constitutional: Negative for appetite change and unexpected weight change.  Psychiatric/Behavioral: Negative for hallucinations, self-injury, sleep disturbance and suicidal ideas.  All other systems reviewed and are negative.    Physical Exam Updated Vital Signs BP 123/74 (BP Location: Left Arm)   Pulse 67   Temp 98.4 F (36.9 C) (Oral)   Resp 14   LMP 08/23/2016 (Within Weeks)   SpO2 100%   Physical Exam  Constitutional: She is oriented to person, place, and time. She appears well-developed and well-nourished.  Pt is alert and oriented. NAD.  HENT:  Head: Normocephalic.  Eyes: Conjunctivae are normal.  Cardiovascular: Normal rate, regular rhythm, normal heart sounds and intact distal pulses.  Exam reveals no gallop and no friction rub.   No murmur heard. Pulmonary/Chest: Effort normal and breath sounds normal. No respiratory distress. She has no wheezes. She has no rales.  Abdominal: She exhibits no distension.  Musculoskeletal: Normal range of motion.  Neurological: She is alert and oriented to person, place, and time. No sensory deficit. She exhibits normal muscle tone. Coordination normal. GCS eye subscore is 4. GCS verbal subscore is 5. GCS motor subscore is 6.  Skin: Skin is warm and dry.  Psychiatric: She has a normal mood and affect. Her behavior is normal. Judgment and thought content normal.  Pt is calm, cooperative. Speech is goal oriented. Making good eye contact. Speech is not tangential.   Nursing note and vitals reviewed.   ED Treatments / Results  DIAGNOSTIC STUDIES:  Oxygen Saturation is 100% on RA,  normal by my interpretation.    COORDINATION OF CARE:  1:22 PM Discussed treatment plan with pt at bedside including mental health resources and pt agreed to plan.   Labs (all labs ordered are listed, but only abnormal results are displayed) Labs Reviewed - No data to display  EKG  EKG Interpretation None       Radiology No results found.  Procedures Procedures (including critical care time)  Medications Ordered in ED Medications - No data to display   Initial Impression / Assessment and Plan / ED Course  I have reviewed the triage vital signs and the nursing notes.  Pertinent labs & imaging results that were available during my care of the patient were reviewed by me and considered in my medical decision making (see chart for details).    BP 123/74 (BP Location: Left Arm)   Pulse 67   Temp 98.4 F (36.9 C) (Oral)   Resp 14   LMP  08/23/2016 (Within Weeks)   SpO2 100%    Final Clinical Impressions(s) / ED Diagnoses   Final diagnoses:  Depression, unspecified depression type    New Prescriptions New Prescriptions   No medications on file    I personally performed the services described in this documentation, which was scribed in my presence. The recorded information has been reviewed and is accurate.   1:33 PM Pt here with feeling of depression, likely from life changing events.  No complaints to suggest of imminent psychiatric instability or medical condition causing her sxs.  She is stable, will provide outpt resources for further psychiatric assessment and treatment.  Return precaution discussed.      Domenic Moras, PA-C 09/12/16 1334    Davonna Belling, MD 09/12/16 [phone]

## 2016-09-12 NOTE — ED Triage Notes (Signed)
Pt states she has been experiencing depression for several weeks. Pt states she has had several life changes in the past few months and states she feels like she cannot get out of bed at times. Pt denies SI/ HI, has not seen PMD for depression and has not experienced depression in the past.

## 2016-11-29 DIAGNOSIS — Z0000 Encounter for general adult medical examination without abnormal findings: Secondary | ICD-10-CM | POA: Diagnosis not present

## 2016-12-09 DIAGNOSIS — Z0000 Encounter for general adult medical examination without abnormal findings: Secondary | ICD-10-CM | POA: Diagnosis not present

## 2017-06-06 DIAGNOSIS — Z6826 Body mass index (BMI) 26.0-26.9, adult: Secondary | ICD-10-CM | POA: Diagnosis not present

## 2017-06-06 DIAGNOSIS — Z1231 Encounter for screening mammogram for malignant neoplasm of breast: Secondary | ICD-10-CM | POA: Diagnosis not present

## 2017-06-06 DIAGNOSIS — Z01419 Encounter for gynecological examination (general) (routine) without abnormal findings: Secondary | ICD-10-CM | POA: Diagnosis not present

## 2017-06-27 DIAGNOSIS — N939 Abnormal uterine and vaginal bleeding, unspecified: Secondary | ICD-10-CM | POA: Diagnosis not present

## 2017-06-27 DIAGNOSIS — D251 Intramural leiomyoma of uterus: Secondary | ICD-10-CM | POA: Diagnosis not present

## 2017-06-27 DIAGNOSIS — D250 Submucous leiomyoma of uterus: Secondary | ICD-10-CM | POA: Diagnosis not present

## 2017-07-03 DIAGNOSIS — N921 Excessive and frequent menstruation with irregular cycle: Secondary | ICD-10-CM | POA: Diagnosis not present

## 2017-07-03 DIAGNOSIS — N939 Abnormal uterine and vaginal bleeding, unspecified: Secondary | ICD-10-CM | POA: Diagnosis not present

## 2017-07-15 ENCOUNTER — Other Ambulatory Visit: Payer: Self-pay | Admitting: Obstetrics and Gynecology

## 2017-07-15 DIAGNOSIS — D250 Submucous leiomyoma of uterus: Secondary | ICD-10-CM

## 2017-07-17 ENCOUNTER — Other Ambulatory Visit: Payer: Self-pay | Admitting: Obstetrics and Gynecology

## 2017-07-17 ENCOUNTER — Encounter: Payer: Self-pay | Admitting: Radiology

## 2017-07-17 ENCOUNTER — Ambulatory Visit
Admission: RE | Admit: 2017-07-17 | Discharge: 2017-07-17 | Disposition: A | Discharge status: Home / Self Care | Payer: BLUE CROSS/BLUE SHIELD | Source: Ambulatory Visit | Attending: Obstetrics and Gynecology | Admitting: Obstetrics and Gynecology

## 2017-07-17 DIAGNOSIS — D250 Submucous leiomyoma of uterus: Secondary | ICD-10-CM

## 2017-07-17 DIAGNOSIS — D259 Leiomyoma of uterus, unspecified: Secondary | ICD-10-CM | POA: Diagnosis not present

## 2017-07-17 DIAGNOSIS — N939 Abnormal uterine and vaginal bleeding, unspecified: Secondary | ICD-10-CM | POA: Diagnosis not present

## 2017-07-17 HISTORY — PX: IR RADIOLOGIST EVAL & MGMT: IMG5224

## 2017-07-17 NOTE — Consult Note (Signed)
Chief Complaint:  Abnormal menstrual bleeding, uterine fibroids   Referring Physician(s): Cousins,Sheronette  History of Present Illness: Karla Wolf is a 42 y.o. female with a history of known uterine fibroids diagnosed in 2015. Patient has symptoms including menorrhagia, dysmenorrhea, and dyspareunia. No future pregnancy plans. She has one 3 year old child. No current menopausal symptoms. Review of her menstrual cycle was performed. Frequency is every 21 days. Length of the cycle Can be up to 12 days with 4 heavy days. Frequency of pad change every 3 hours. Occasional passage of blood clots. Minimal occasional interperiod bleeding. She also has bulk related symptoms with urinary urgency and frequency as well as bloating and abdominal pressure. Recent Pap smear 06/06/2017 was negative. Endometrial biopsy 07/03/2017 was also negative for hyperplasia or malignancy. Ultrasound 06/27/2017 confirms multiple small fibroids, measuring up to 2.2 cm.  Past Medical History:  Diagnosis Date  . Medical history non-contributory   . SVD (spontaneous vaginal delivery)    x 2  . Uterine fibroid     Past Surgical History:  Procedure Laterality Date  . KNEE RECONSTRUCTION  2000   right knee  . MYOMECTOMY N/A 06/03/2013   Procedure: Exploratory Laparotomy MYOMECTOMY;  Surgeon: Marvene Staff, MD;  Location: [facility];  Service: Gynecology;  Laterality: N/A;    Allergies: Patient has no known allergies.  Medications: Prior to Admission medications   Medication Sig Start Date End Date Taking? Authorizing Provider  cyclobenzaprine (FLEXERIL) 10 MG tablet Take 1 tablet (10 mg total) by mouth 2 (two) times daily as needed for muscle spasms. Patient not taking: Reported on 02/19/2015 10/28/13   Charlann Lange, PA-C  doxycycline (VIBRAMYCIN) 100 MG capsule Take 1 capsule (100 mg total) by mouth 2 (two) times daily. Patient not taking: Reported on 08/05/2016 02/19/15   Hedges, Dellis Filbert, PA-C    ibuprofen (ADVIL,MOTRIN) 800 MG tablet Take 1 tablet (800 mg total) by mouth 3 (three) times daily. Patient not taking: Reported on 02/19/2015 10/28/13   Charlann Lange, PA-C  metroNIDAZOLE (FLAGYL) 500 MG tablet Take 1 tablet (500 mg total) by mouth 2 (two) times daily. Patient not taking: Reported on 08/05/2016 02/19/15   Hedges, Dellis Filbert, PA-C  naproxen (NAPROSYN) 500 MG tablet Take 1 tablet (500 mg total) by mouth 2 (two) times daily. Patient not taking: Reported on 02/19/2015 11/02/13   Fredia Sorrow, MD  traMADol (ULTRAM) 50 MG tablet Take 1 tablet (50 mg total) by mouth every 6 (six) hours as needed. Patient not taking: Reported on 02/19/2015 11/02/13   Fredia Sorrow, MD     Family History  Problem Relation Age of Onset  . Hypertension Father   . Hypertension Sister   . Diabetes Sister   . Diabetes Paternal Grandmother     Social History   Socioeconomic History  . Marital status: Legally Separated    Spouse name: Not on file  . Number of children: Not on file  . Years of education: Not on file  . Highest education level: Not on file  Social Needs  . Financial resource strain: Not on file  . Food insecurity - worry: Not on file  . Food insecurity - inability: Not on file  . Transportation needs - medical: Not on file  . Transportation needs - non-medical: Not on file  Occupational History  . Not on file  Tobacco Use  . Smoking status: Never Smoker  . Smokeless tobacco: Never Used  Substance and Sexual Activity  . Alcohol use: Yes  Comment: social 2/month  . Drug use: No  . Sexual activity: Yes    Birth control/protection: None  Other Topics Concern  . Not on file  Social History Narrative  . Not on file     Review of Systems: A 12 point ROS discussed and pertinent positives are indicated in the HPI above.  All other systems are negative.  Review of Systems  Vital Signs: BP 127/83   Pulse (!) 56   Temp 98.3 F (36.8 C) (Oral)   Resp 14   Ht 5'  6.75" (1.695 m)   Wt 165 lb (74.8 kg)   LMP 07/15/2017 (Exact Date)   SpO2 100%   BMI 26.04 kg/m   Physical Exam  Constitutional: She is oriented to person, place, and time. She appears well-developed and well-nourished. No distress.  Eyes: Conjunctivae are normal. No scleral icterus.  Cardiovascular: Intact distal pulses.  No murmur heard. Abdominal: Soft. Bowel sounds are normal. She exhibits no distension. There is no tenderness. There is no rebound and no guarding.  Enlarged fibroid uterus noted in the lower abdomen and pelvis measure approximately 12 weeks.  Musculoskeletal: She exhibits no edema, tenderness or deformity.  Right knee anterior scar from previous surgery  Neurological: She is alert and oriented to person, place, and time.  Skin: She is not diaphoretic.    Imaging: No results found.  Labs:  CBC: Recent Labs    08/05/16 2108  WBC 9.8  HGB 12.7  HCT 38.3  PLT 286    COAGS: No results for input(s): INR, APTT in the last 8760 hours.  BMP: Recent Labs    08/05/16 2108  NA 137  K 3.9  CL 106  CO2 25  GLUCOSE 92  BUN 16  CALCIUM 8.9  CREATININE 0.93  GFRNONAA >60  GFRAA >60    LIVER FUNCTION TESTS: No results for input(s): BILITOT, AST, ALT, ALKPHOS, PROT, ALBUMIN in the last 8760 hours.   Assessment and Plan:  Systematic uterine fibroids with abnormal menstrual bleeding as detailed above. The uterine fibroid embolization procedure was reviewed in detail including the procedure, risks, benefits and alternatives. The procedure, recovery, expected goals and outcomes were all reviewed as well as the need for outpatient follow-up. All questions were addressed. She has clear understanding of the procedure. She would like to continue with the workup. She has been scheduled for a pelvic MRI on 07/18/2017 to assess her fibroid anatomy for embolization. Anticipate fibroid embolization procedure the next few months at [facility]  electively.  Thank you for this interesting consult.  I greatly enjoyed meeting Lakelyn Straus and look forward to participating in their care.  A copy of this report was sent to the requesting provider on this date.  Electronically Signed: Greggory Keen 07/17/2017, 8:48 AM   I spent a total of  30 Minutes   in face to face in clinical consultation, greater than 50% of which was counseling/coordinating care for this patient with symptomatic uterine fibroids.

## 2017-07-18 ENCOUNTER — Ambulatory Visit
Admission: RE | Admit: 2017-07-18 | Discharge: 2017-07-18 | Disposition: A | Discharge status: Home / Self Care | Payer: BLUE CROSS/BLUE SHIELD | Source: Ambulatory Visit | Attending: Obstetrics and Gynecology | Admitting: Obstetrics and Gynecology

## 2017-07-18 DIAGNOSIS — D250 Submucous leiomyoma of uterus: Secondary | ICD-10-CM

## 2017-07-18 DIAGNOSIS — D259 Leiomyoma of uterus, unspecified: Secondary | ICD-10-CM | POA: Diagnosis not present

## 2017-07-18 MED ORDER — GADOBENATE DIMEGLUMINE 529 MG/ML IV SOLN
15.0000 mL | Freq: Once | INTRAVENOUS | Status: AC | PRN
  Administered 2017-07-18: 15 mL via INTRAVENOUS

## 2017-07-22 ENCOUNTER — Other Ambulatory Visit: Payer: Managed Care, Other (non HMO)

## 2017-07-25 ENCOUNTER — Other Ambulatory Visit (HOSPITAL_COMMUNITY): Payer: Self-pay | Admitting: Interventional Radiology

## 2017-07-25 DIAGNOSIS — D259 Leiomyoma of uterus, unspecified: Secondary | ICD-10-CM

## 2017-08-05 ENCOUNTER — Other Ambulatory Visit: Payer: Self-pay | Admitting: Radiology

## 2017-08-07 ENCOUNTER — Ambulatory Visit (HOSPITAL_COMMUNITY)
Admission: RE | Admit: 2017-08-07 | Discharge: 2017-08-07 | Disposition: A | Discharge status: Home / Self Care | Payer: BLUE CROSS/BLUE SHIELD | Source: Ambulatory Visit | Attending: Interventional Radiology | Admitting: Interventional Radiology

## 2017-08-07 ENCOUNTER — Observation Stay (HOSPITAL_COMMUNITY)
Admission: RE | Admit: 2017-08-07 | Discharge: 2017-08-08 | Disposition: A | Discharge status: Home / Self Care | Payer: BLUE CROSS/BLUE SHIELD | Source: Ambulatory Visit | Attending: Interventional Radiology | Admitting: Interventional Radiology

## 2017-08-07 ENCOUNTER — Encounter (HOSPITAL_COMMUNITY): Payer: Self-pay

## 2017-08-07 DIAGNOSIS — D259 Leiomyoma of uterus, unspecified: Secondary | ICD-10-CM | POA: Diagnosis not present

## 2017-08-07 DIAGNOSIS — D250 Submucous leiomyoma of uterus: Secondary | ICD-10-CM | POA: Diagnosis not present

## 2017-08-07 HISTORY — PX: IR ANGIOGRAM SELECTIVE EACH ADDITIONAL VESSEL: IMG667

## 2017-08-07 HISTORY — PX: IR ANGIOGRAM PELVIS SELECTIVE OR SUPRASELECTIVE: IMG661

## 2017-08-07 HISTORY — PX: IR US GUIDE VASC ACCESS RIGHT: IMG2390

## 2017-08-07 HISTORY — PX: IR EMBO TUMOR ORGAN ISCHEMIA INFARCT INC GUIDE ROADMAPPING: IMG5449

## 2017-08-07 HISTORY — PX: IR US GUIDE VASC ACCESS LEFT: IMG2389

## 2017-08-07 LAB — CBC WITH DIFFERENTIAL/PLATELET
BASOS ABS: 0 10*3/uL (ref 0.0–0.1)
Basophils Relative: 0 %
EOS PCT: 1 %
Eosinophils Absolute: 0.1 10*3/uL (ref 0.0–0.7)
HCT: 39.9 % (ref 36.0–46.0)
HEMOGLOBIN: 13 g/dL (ref 12.0–15.0)
LYMPHS PCT: 32 %
Lymphs Abs: 2.2 10*3/uL (ref 0.7–4.0)
MCH: 26.8 pg (ref 26.0–34.0)
MCHC: 32.6 g/dL (ref 30.0–36.0)
MCV: 82.3 fL (ref 78.0–100.0)
Monocytes Absolute: 0.4 10*3/uL (ref 0.1–1.0)
Monocytes Relative: 5 %
NEUTROS ABS: 4.2 10*3/uL (ref 1.7–7.7)
NEUTROS PCT: 62 %
PLATELETS: 335 10*3/uL (ref 150–400)
RBC: 4.85 MIL/uL (ref 3.87–5.11)
RDW: 14.2 % (ref 11.5–15.5)
WBC: 6.8 10*3/uL (ref 4.0–10.5)

## 2017-08-07 LAB — TYPE AND SCREEN
ABO/RH(D): O POS
ANTIBODY SCREEN: NEGATIVE

## 2017-08-07 LAB — PROTIME-INR
INR: 0.96
Prothrombin Time: 12.7 seconds (ref 11.4–15.2)

## 2017-08-07 LAB — ABO/RH: ABO/RH(D): O POS

## 2017-08-07 LAB — HCG, SERUM, QUALITATIVE: Preg, Serum: NEGATIVE

## 2017-08-07 MED ORDER — CEFAZOLIN SODIUM-DEXTROSE 2-4 GM/100ML-% IV SOLN
INTRAVENOUS | Status: AC
  Administered 2017-08-07: 2 g via INTRAVENOUS
  Filled 2017-08-07: qty 100

## 2017-08-07 MED ORDER — LIDOCAINE HCL (PF) 1 % IJ SOLN
INTRAMUSCULAR | Status: AC | PRN
  Administered 2017-08-07: 5 mL

## 2017-08-07 MED ORDER — SODIUM CHLORIDE 0.9% FLUSH: 3.0000 mL | Freq: Two times a day (BID) | INTRAVENOUS | Status: DC

## 2017-08-07 MED ORDER — CEFAZOLIN SODIUM-DEXTROSE 2-4 GM/100ML-% IV SOLN
2.0000 g | INTRAVENOUS | Status: AC
  Administered 2017-08-07: 2 g via INTRAVENOUS

## 2017-08-07 MED ORDER — IOPAMIDOL (ISOVUE-300) INJECTION 61%
100.0000 mL | Freq: Once | INTRAVENOUS | Status: AC | PRN
  Administered 2017-08-07: 80 mL via INTRA_ARTERIAL

## 2017-08-07 MED ORDER — KETOROLAC TROMETHAMINE 30 MG/ML IJ SOLN
30.0000 mg | Freq: Four times a day (QID) | INTRAMUSCULAR | Status: DC
  Administered 2017-08-07 – 2017-08-08 (×3): 30 mg via INTRAVENOUS
  Filled 2017-08-07 (×3): qty 1

## 2017-08-07 MED ORDER — SODIUM CHLORIDE 0.9 % IV SOLN: 250.0000 mL | INTRAVENOUS | Status: DC | PRN

## 2017-08-07 MED ORDER — DIPHENHYDRAMINE HCL 50 MG/ML IJ SOLN: 12.5000 mg | Freq: Four times a day (QID) | INTRAMUSCULAR | Status: DC | PRN

## 2017-08-07 MED ORDER — SODIUM CHLORIDE 0.9% FLUSH: 3.0000 mL | INTRAVENOUS | Status: DC | PRN

## 2017-08-07 MED ORDER — MIDAZOLAM HCL 2 MG/2ML IJ SOLN
INTRAMUSCULAR | Status: AC | PRN
  Administered 2017-08-07 (×3): 1 mg via INTRAVENOUS

## 2017-08-07 MED ORDER — PROMETHAZINE HCL 25 MG RE SUPP
25.0000 mg | Freq: Three times a day (TID) | RECTAL | Status: DC | PRN
  Administered 2017-08-07: 25 mg via RECTAL
  Filled 2017-08-07 (×2): qty 1

## 2017-08-07 MED ORDER — KETOROLAC TROMETHAMINE 30 MG/ML IJ SOLN
30.0000 mg | INTRAMUSCULAR | Status: AC
  Administered 2017-08-07: 30 mg via INTRAVENOUS
  Filled 2017-08-07: qty 1

## 2017-08-07 MED ORDER — SODIUM CHLORIDE 0.9 % IV SOLN
INTRAVENOUS | Status: DC
  Administered 2017-08-07 – 2017-08-08 (×3): via INTRAVENOUS

## 2017-08-07 MED ORDER — DIPHENHYDRAMINE HCL 12.5 MG/5ML PO ELIX
12.5000 mg | ORAL_SOLUTION | Freq: Four times a day (QID) | ORAL | Status: DC | PRN
  Filled 2017-08-07: qty 5

## 2017-08-07 MED ORDER — NITROGLYCERIN IN D5W 100-5 MCG/ML-% IV SOLN
INTRAVENOUS | Status: AC
  Filled 2017-08-07: qty 250

## 2017-08-07 MED ORDER — MIDAZOLAM HCL 2 MG/2ML IJ SOLN
INTRAMUSCULAR | Status: AC
  Filled 2017-08-07: qty 6

## 2017-08-07 MED ORDER — FENTANYL CITRATE (PF) 100 MCG/2ML IJ SOLN
INTRAMUSCULAR | Status: AC
  Filled 2017-08-07: qty 4

## 2017-08-07 MED ORDER — LIDOCAINE HCL 1 % IJ SOLN
INTRAMUSCULAR | Status: AC
  Filled 2017-08-07: qty 20

## 2017-08-07 MED ORDER — FENTANYL CITRATE (PF) 100 MCG/2ML IJ SOLN
INTRAMUSCULAR | Status: AC | PRN
  Administered 2017-08-07 (×3): 50 ug via INTRAVENOUS

## 2017-08-07 MED ORDER — ONDANSETRON HCL 4 MG/2ML IJ SOLN
4.0000 mg | Freq: Four times a day (QID) | INTRAMUSCULAR | Status: DC | PRN
  Filled 2017-08-07: qty 2

## 2017-08-07 MED ORDER — DOCUSATE SODIUM 100 MG PO CAPS
100.0000 mg | ORAL_CAPSULE | Freq: Two times a day (BID) | ORAL | Status: DC
  Administered 2017-08-08: 100 mg via ORAL
  Filled 2017-08-07 (×3): qty 1

## 2017-08-07 MED ORDER — SODIUM CHLORIDE 0.9% FLUSH: 9.0000 mL | INTRAVENOUS | Status: DC | PRN

## 2017-08-07 MED ORDER — IOPAMIDOL (ISOVUE-300) INJECTION 61%
INTRAVENOUS | Status: AC
  Administered 2017-08-07: 80 mL via INTRA_ARTERIAL
  Filled 2017-08-07: qty 200

## 2017-08-07 MED ORDER — HYDROMORPHONE 1 MG/ML IV SOLN
INTRAVENOUS | Status: DC
  Administered 2017-08-07: 1.8 mg via INTRAVENOUS
  Administered 2017-08-07: 1.5 mg via INTRAVENOUS
  Administered 2017-08-07: 16:00:00 via INTRAVENOUS
  Administered 2017-08-08: 2.1 mg via INTRAVENOUS
  Filled 2017-08-07: qty 25

## 2017-08-07 MED ORDER — DEXAMETHASONE 4 MG PO TABS
8.0000 mg | ORAL_TABLET | Freq: Once | ORAL | Status: AC
  Administered 2017-08-07: 8 mg via ORAL
  Filled 2017-08-07 (×2): qty 2

## 2017-08-07 MED ORDER — ONDANSETRON HCL 4 MG/2ML IJ SOLN
INTRAMUSCULAR | Status: AC
  Filled 2017-08-07: qty 2

## 2017-08-07 MED ORDER — PROMETHAZINE HCL 25 MG PO TABS
25.0000 mg | ORAL_TABLET | Freq: Three times a day (TID) | ORAL | Status: DC | PRN
  Administered 2017-08-08: 25 mg via ORAL
  Filled 2017-08-07 (×2): qty 1

## 2017-08-07 MED ORDER — IOPAMIDOL (ISOVUE-300) INJECTION 61%
100.0000 mL | Freq: Once | INTRAVENOUS | Status: AC | PRN
  Administered 2017-08-07: 30 mL via INTRA_ARTERIAL

## 2017-08-07 MED ORDER — NALOXONE HCL 0.4 MG/ML IJ SOLN: 0.4000 mg | INTRAMUSCULAR | Status: DC | PRN

## 2017-08-07 MED ORDER — NITROGLYCERIN 1 MG/10 ML FOR IR/CATH LAB
INTRA_ARTERIAL | Status: AC | PRN
  Administered 2017-08-07: 100 ug via INTRA_ARTERIAL

## 2017-08-07 MED ORDER — ONDANSETRON HCL 4 MG/2ML IJ SOLN
4.0000 mg | Freq: Four times a day (QID) | INTRAMUSCULAR | Status: DC | PRN
  Administered 2017-08-07 – 2017-08-08 (×2): 4 mg via INTRAVENOUS
  Filled 2017-08-07 (×2): qty 2

## 2017-08-07 NOTE — Procedures (Signed)
Uterine fibroids with abnormal uterine bleeding  S/p UFE No comp EBL min Overnight obs Full report in pacs

## 2017-08-07 NOTE — H&P (Signed)
Chief Complaint: Patient was seen in consultation today for uterine fibroids  Referring Physician(s): Shick,Michael  Supervising Physician: Daryll Brod  Patient Status: [facility] - Out-pt  History of Present Illness: Karla Wolf is a 42 y.o. female with medical history of uterine fibroids and abnormal uterine bleeding.  She met with Dr. Annamaria Boots in clinic 07/17/17 to discuss management options of her symptoms and elected to proceed with uterine artery embolization.  Patient presents for procedure today in her usual state of health.  She has been NPO. She does not take blood thinners.   Past Medical History:  Diagnosis Date  . Medical history non-contributory   . SVD (spontaneous vaginal delivery)    x 2  . Uterine fibroid     Past Surgical History:  Procedure Laterality Date  . IR RADIOLOGIST EVAL & MGMT  07/17/2017  . KNEE RECONSTRUCTION  2000   right knee  . MYOMECTOMY N/A 06/03/2013   Procedure: Exploratory Laparotomy MYOMECTOMY;  Surgeon: Marvene Staff, MD;  Location: [facility];  Service: Gynecology;  Laterality: N/A;    Allergies: Patient has no known allergies.  Medications: Prior to Admission medications   Medication Sig Start Date End Date Taking? Authorizing Provider  cyclobenzaprine (FLEXERIL) 10 MG tablet Take 1 tablet (10 mg total) by mouth 2 (two) times daily as needed for muscle spasms. Patient not taking: Reported on 02/19/2015 10/28/13   Charlann Lange, PA-C  doxycycline (VIBRAMYCIN) 100 MG capsule Take 1 capsule (100 mg total) by mouth 2 (two) times daily. Patient not taking: Reported on 08/05/2016 02/19/15   Hedges, Dellis Filbert, PA-C  ibuprofen (ADVIL,MOTRIN) 800 MG tablet Take 1 tablet (800 mg total) by mouth 3 (three) times daily. Patient not taking: Reported on 02/19/2015 10/28/13   Charlann Lange, PA-C  metroNIDAZOLE (FLAGYL) 500 MG tablet Take 1 tablet (500 mg total) by mouth 2 (two) times daily. Patient not taking: Reported on 08/05/2016 02/19/15    Hedges, Dellis Filbert, PA-C  naproxen (NAPROSYN) 500 MG tablet Take 1 tablet (500 mg total) by mouth 2 (two) times daily. Patient not taking: Reported on 02/19/2015 11/02/13   Fredia Sorrow, MD  traMADol (ULTRAM) 50 MG tablet Take 1 tablet (50 mg total) by mouth every 6 (six) hours as needed. Patient not taking: Reported on 02/19/2015 11/02/13   Fredia Sorrow, MD     Family History  Problem Relation Age of Onset  . Hypertension Father   . Hypertension Sister   . Diabetes Sister   . Diabetes Paternal Grandmother     Social History   Socioeconomic History  . Marital status: Legally Separated    Spouse name: Not on file  . Number of children: Not on file  . Years of education: Not on file  . Highest education level: Not on file  Occupational History  . Not on file  Social Needs  . Financial resource strain: Not on file  . Food insecurity:    Worry: Not on file    Inability: Not on file  . Transportation needs:    Medical: Not on file    Non-medical: Not on file  Tobacco Use  . Smoking status: Never Smoker  . Smokeless tobacco: Never Used  Substance and Sexual Activity  . Alcohol use: Yes    Comment: social 2/month  . Drug use: No  . Sexual activity: Yes    Birth control/protection: None  Lifestyle  . Physical activity:    Days per week: Not on file    Minutes per session: Not  on file  . Stress: Not on file  Relationships  . Social connections:    Talks on phone: Not on file    Gets together: Not on file    Attends religious service: Not on file    Active member of club or organization: Not on file    Attends meetings of clubs or organizations: Not on file    Relationship status: Not on file  Other Topics Concern  . Not on file  Social History Narrative  . Not on file     Review of Systems: A 12 point ROS discussed and pertinent positives are indicated in the HPI above.  All other systems are negative.  Review of Systems  Constitutional: Negative for fatigue  and fever.  Respiratory: Negative for cough and shortness of breath.   Cardiovascular: Negative for chest pain.  Gastrointestinal: Negative for abdominal pain.  Genitourinary: Positive for menstrual problem.  Musculoskeletal: Negative for back pain.  Psychiatric/Behavioral: Negative for behavioral problems and confusion.    Vital Signs: BP (!) 142/90 (BP Location: Right Arm)   Pulse (!) 51   Temp 98.8 F (37.1 C) (Oral)   Resp 16   LMP 07/15/2017 (Exact Date)   SpO2 100%   Physical Exam  Constitutional: She is oriented to person, place, and time. She appears well-developed.  Cardiovascular: Normal rate, regular rhythm and normal heart sounds.  Pulmonary/Chest: Effort normal and breath sounds normal. No respiratory distress.  Abdominal: Soft. She exhibits no distension.  Neurological: She is alert and oriented to person, place, and time.  Skin: Skin is warm and dry.  Psychiatric: She has a normal mood and affect. Her behavior is normal. Judgment and thought content normal.  Nursing note and vitals reviewed.    MD Evaluation Airway: WNL Heart: WNL Abdomen: WNL Chest/ Lungs: WNL ASA  Classification: 3 Mallampati/Airway Score: One   Imaging: Mr Pelvis W Wo Contrast  Result Date: 07/18/2017 CLINICAL DATA:  Fibroids. Previous myomectomy. Evaluation for uterine artery embolization. EXAM: MRI PELVIS WITHOUT AND WITH CONTRAST TECHNIQUE: Multiplanar multisequence MR imaging of the pelvis was performed both before and after administration of intravenous contrast. CONTRAST:  56m MULTIHANCE GADOBENATE DIMEGLUMINE 529 MG/ML IV SOLN COMPARISON:  CT on 02/19/2015 FINDINGS: Urinary Tract: No bladder or urethral abnormality identified. Bowel:  Unremarkable visualized pelvic bowel loops. Vascular/Lymphatic: No pathologically enlarged lymph nodes or other significant abnormality. Reproductive: -- Uterus: Measures 9.2 x 6.4 by 8.0 cm (volume = 250 cm^3). Multiple small intramural fibroids are  seen involving the uterus diffusely. These fibroids range in size from less than 1 cm to 2.7 cm. Thin endometrium measuring 5 mm. Normal appearance of cervix and vagina. -- Intracavitary fibroids:  None. -- Pedunculated fibroids: None. -- Fibroid contrast enhancement: All fibroids show contrast enhancement, without significant degeneration/devascularization. -- Right ovary:  Appears normal.  No mass identified. -- Left ovary:  Appears normal.  Mild left hydrosalpinx. Other: Small amount of free fluid in cul-de-sac. Musculoskeletal:  Unremarkable. IMPRESSION: Numerous small intramural fibroids measuring up to 2.7 cm. No intracavitary or pedunculated fibroids identified. Normal appearance of both ovaries. Mild left hydrosalpinx and small amount of free pelvic fluid. Electronically Signed   By: JEarle GellM.D.   On: 07/18/2017 11:55   Ir Radiologist Eval & Mgmt  Result Date: 07/17/2017 Please refer to notes tab for details about interventional procedure. (Op Note)   Labs:  CBC: Recent Labs    08/07/17 1205  WBC 6.8  HGB 13.0  HCT 39.9  PLT  335    COAGS: Recent Labs    08/07/17 1205  INR 0.96    BMP: No results for input(s): NA, K, CL, CO2, GLUCOSE, BUN, CALCIUM, CREATININE, GFRNONAA, GFRAA in the last 8760 hours.  Invalid input(s): CMP  LIVER FUNCTION TESTS: No results for input(s): BILITOT, AST, ALT, ALKPHOS, PROT, ALBUMIN in the last 8760 hours.  TUMOR MARKERS: No results for input(s): AFPTM, CEA, CA199, CHROMGRNA in the last 8760 hours.  Assessment and Plan: Patient with past medical history of symptomatic uterine fibroids presents for uterine artery embolization procedure.  She has met with Dr. Annamaria Boots in clinic and elects to proceed today. Patient presents today in their usual state of health.  She has been NPO and is not currently on blood thinners.  Risks and benefits were discussed with the patient including, but not limited to bleeding, infection, vascular injury or  contrast induced renal failure.  This interventional procedure involves the use of X-rays and because of the nature of the planned procedure, it is possible that we will have prolonged use of X-ray fluoroscopy.  Potential radiation risks to you include (but are not limited to) the following: - A slightly elevated risk for cancer  several years later in life. This risk is typically less than 0.5% percent. This risk is low in comparison to the normal incidence of human cancer, which is 33% for women and 50% for men according to the [place]. - Radiation induced injury can include skin redness, resembling a rash, tissue breakdown / ulcers and hair loss (which can be temporary or permanent).   The likelihood of either of these occurring depends on the difficulty of the procedure and whether you are sensitive to radiation due to previous procedures, disease, or genetic conditions.   IF your procedure requires a prolonged use of radiation, you will be notified and given written instructions for further action.  It is your responsibility to monitor the irradiated area for the 2 weeks following the procedure and to notify your physician if you are concerned that you have suffered a radiation induced injury.  All of the patient's questions were answered, patient is agreeable to proceed.  Consent signed and in chart.  Thank you for this interesting consult.  I greatly enjoyed meeting Karla Wolf and look forward to participating in their care.  A copy of this report was sent to the requesting provider on this date.  Electronically Signed: Docia Barrier, PA 08/07/2017, 1:12 PM   I spent a total of    15 Minutes in face to face in clinical consultation, greater than 50% of which was counseling/coordinating care for symptomatic uterine fibroids.

## 2017-08-08 ENCOUNTER — Other Ambulatory Visit: Payer: Self-pay

## 2017-08-08 DIAGNOSIS — D250 Submucous leiomyoma of uterus: Secondary | ICD-10-CM | POA: Diagnosis not present

## 2017-08-08 DIAGNOSIS — D259 Leiomyoma of uterus, unspecified: Secondary | ICD-10-CM | POA: Diagnosis not present

## 2017-08-08 MED ORDER — HYDROCODONE-ACETAMINOPHEN 5-325 MG PO TABS
1.0000 | ORAL_TABLET | ORAL | Status: DC | PRN
  Administered 2017-08-08: 2 via ORAL
  Filled 2017-08-08: qty 2

## 2017-08-08 NOTE — Progress Notes (Signed)
Foley removed per order around 2200 last night. Pt reported this morning that she feels like she has to void, but is unable to. Bladder scan showing 512cc. Reported this information to MD. He stated to wait until 0800 to do I&O cath if needed. Pt is currently up walking in hall to try to stimulate bladder. Continue to monitor. Hortencia Conradi RN

## 2017-08-08 NOTE — Discharge Summary (Signed)
Patient ID: Karla Wolf MRN: [id number] DOB/AGE: Feb 20, 1976 42 y.o.  Admit date: 08/07/2017 Discharge date: 08/08/2017  Supervising Physician: Daryll Brod  Patient Status: [facility] - In-pt  Admission Diagnoses: Symptomatic uterine fibroids  Discharge Diagnoses: Symptomatic uterine fibroids, status post successful bilateral uterine artery embolization on 08/07/17 Active Problems:   Fibroids, submucosal  Past Medical History:  Diagnosis Date  . Medical history non-contributory   . SVD (spontaneous vaginal delivery)    x 2  . Uterine fibroid    Past Surgical History:  Procedure Laterality Date  . IR ANGIOGRAM PELVIS SELECTIVE OR SUPRASELECTIVE  08/07/2017  . IR ANGIOGRAM PELVIS SELECTIVE OR SUPRASELECTIVE  08/07/2017  . IR ANGIOGRAM SELECTIVE EACH ADDITIONAL VESSEL  08/07/2017  . IR ANGIOGRAM SELECTIVE EACH ADDITIONAL VESSEL  08/07/2017  . IR EMBO TUMOR ORGAN ISCHEMIA INFARCT INC GUIDE ROADMAPPING  08/07/2017  . IR RADIOLOGIST EVAL & MGMT  07/17/2017  . IR US GUIDE VASC ACCESS LEFT  08/07/2017  . IR US GUIDE VASC ACCESS RIGHT  08/07/2017  . KNEE RECONSTRUCTION  2000   right knee  . MYOMECTOMY N/A 06/03/2013   Procedure: Exploratory Laparotomy MYOMECTOMY;  Surgeon: Marvene Staff, MD;  Location: [facility];  Service: Gynecology;  Laterality: N/A;      Discharged Condition: good  Hospital Course: Ms. Karla Wolf is a 42 year old female seen in consultation by Dr. Annamaria Boots on 07/17/17 to discuss treatment options for symptomatic uterine fibroids.  She was deemed an appropriate candidate for bilateral uterine artery embolization and underwent procedure on 08/07/17.  She tolerated the procedure well without immediate complications. She was subsequently admitted for overnight observation.  Overnight the patient did well with exception of expected intermittent nausea and pelvic cramping.  She was placed on Dilaudid PCA and given antiemetics as needed.  On the morning of discharge she was stable.  She  continued to have some mild pelvic cramping and occasional nausea.  She was able to ambulate, void and tolerate liquids without difficulty.  She was deemed stable for discharge.  She was given prescriptions for ibuprofen 600 mg, #20, no refill, 1 tablet every 6 hours for the next 5 days; Colace 100 mg, #20, no refill, 1 tablet twice daily as needed for constipation; Norco 5/325, #30, no refill, 1-2 tablets every 4-6 hours for moderate to severe pain; Zofran 8 mg, #20, no refill, 1 tablet twice daily as needed for nausea.  She will be scheduled for follow-up in the IR clinic with Dr. Annamaria Boots in 3-4 weeks.  She will follow-up with Dr. Garwin Brothers as scheduled.  Consults: none  Significant Diagnostic Studies:  Results for orders placed or performed during the hospital encounter of 08/07/17  CBC with Differential/Platelet  Result Value Ref Range   WBC 6.8 4.0 - 10.5 K/uL   RBC 4.85 3.87 - 5.11 MIL/uL   Hemoglobin 13.0 12.0 - 15.0 g/dL   HCT 39.9 36.0 - 46.0 %   MCV 82.3 78.0 - 100.0 fL   MCH 26.8 26.0 - 34.0 pg   MCHC 32.6 30.0 - 36.0 g/dL   RDW 14.2 11.5 - 15.5 %   Platelets 335 150 - 400 K/uL   Neutrophils Relative % 62 %   Neutro Abs 4.2 1.7 - 7.7 K/uL   Lymphocytes Relative 32 %   Lymphs Abs 2.2 0.7 - 4.0 K/uL   Monocytes Relative 5 %   Monocytes Absolute 0.4 0.1 - 1.0 K/uL   Eosinophils Relative 1 %   Eosinophils Absolute 0.1 0.0 - 0.7 K/uL  Basophils Relative 0 %   Basophils Absolute 0.0 0.0 - 0.1 K/uL  hCG, serum, qualitative  Result Value Ref Range   Preg, Serum NEGATIVE NEGATIVE  Protime-INR  Result Value Ref Range   Prothrombin Time 12.7 11.4 - 15.2 seconds   INR 0.96   Type and screen  Result Value Ref Range   ABO/RH(D) O POS    Antibody Screen NEG    Sample Expiration      08/10/2017 Performed at [facility], [place] [address], [place], [place] [address]   ABO/Rh  Result Value Ref Range   ABO/RH(D)      O POS Performed at [facility] [address], [place], [place] [address]      Treatments: Successful bilateral uterine artery embolization on 08/07/17  Discharge Exam: Blood pressure 140/74, pulse 66, temperature 98.1 F (36.7 C), temperature source Oral, resp. rate 14, last menstrual period 07/15/2017, SpO2 100 %. Patient awake, alert.  Chest clear to auscultation bilaterally.  Heart with regular rate and rhythm.  Abdomen soft, positive bowel sounds, mild pelvic tenderness to palpation.  Puncture site right common femoral artery soft, clean, dry, nontender, no hematoma. No  lower extremity edema, intact distal pulses.  Disposition: home  Discharge Instructions    Call MD for:  difficulty breathing, headache or visual disturbances   Complete by:  As directed    Call MD for:  extreme fatigue   Complete by:  As directed    Call MD for:  hives   Complete by:  As directed    Call MD for:  persistant dizziness or light-headedness   Complete by:  As directed    Call MD for:  persistant nausea and vomiting   Complete by:  As directed    Call MD for:  redness, tenderness, or signs of infection (pain, swelling, redness, odor or green/yellow discharge around incision site)   Complete by:  As directed    Call MD for:  severe uncontrolled pain   Complete by:  As directed    Call MD for:  temperature >100.4   Complete by:  As directed    Change dressing (specify)   Complete by:  As directed    May Place Band-Aid over puncture site right groin for the next 2-3 days and change daily.  May wash site with soap and water.   Diet - low sodium heart healthy   Complete by:  As directed    Driving Restrictions   Complete by:  As directed    No driving for 24 hours or with narcotic use   Increase activity slowly   Complete by:  As directed    Lifting restrictions   Complete by:  As directed    No heavy lifting for 3-4 days   May shower / Bathe   Complete by:  As directed    May walk up steps   Complete by:  As  directed    Sexual Activity Restrictions   Complete by:  As directed    No sexual intercourse for 1 week     Allergies as of 08/08/2017   No Known Allergies     Medication List    STOP taking these medications   cyclobenzaprine 10 MG tablet Commonly known as:  FLEXERIL   doxycycline 100 MG capsule Commonly known as:  VIBRAMYCIN   ibuprofen 800 MG tablet Commonly known as:  ADVIL,MOTRIN   metroNIDAZOLE 500 MG tablet Commonly known as:  FLAGYL  naproxen 500 MG tablet Commonly known as:  NAPROSYN   traMADol 50 MG tablet Commonly known as:  ULTRAM            Discharge Care Instructions  (From admission, onward)        Start     Ordered   08/08/17 0000  Change dressing (specify)    Comments:  May Place Band-Aid over puncture site right groin for the next 2-3 days and change daily.  May wash site with soap and water.   08/08/17 1106     Follow-up Information    Servando Salina, MD Follow up.   Specialty:  Obstetrics and Gynecology Why:  Follow-up with Dr. Garwin Brothers as scheduled Contact information: [place] [address] [phone]        Greggory Keen, MD Follow up.   Specialties:  Interventional Radiology, Radiology Why:  Radiology service will call you with follow-up appointment with Dr. Annamaria Boots in the IR clinic in 3-4 weeks; call [phone] or [phone] with any questions Contact information: [address] [place] [address] [phone]            Electronically Signed: D. Rowe Robert, PA-C 08/08/2017, 11:09 AM   I have spent Less Than 30 Minutes discharging Stacy Gardner.

## 2017-08-08 NOTE — Discharge Instructions (Signed)
Uterine Artery Embolization for Fibroids, Care After °Refer to this sheet in the next few weeks. These instructions provide you with information on caring for yourself after your procedure. Your health care provider may also give you more specific instructions. Your treatment has been planned according to current medical practices, but problems sometimes occur. Call your health care provider if you have any problems or questions after your procedure. °What can I expect after the procedure? °After your procedure, it is typical to have cramping in the pelvis. You will be given pain medicine to control it. °Follow these instructions at home: °· Only take over-the-counter or prescription medicines for pain, discomfort, or fever as directed by your health care provider. °· Do not take aspirin. It can cause bleeding. °· Follow your health care provider’s advice regarding medicines given to you, diet, activity, and when to begin sexual activity. °· See your health care provider for follow-up care as directed. °Contact a health care provider if: °· You have a fever. °· You have redness, swelling, and pain around your incision site. °· You have pus draining from your incision. °· You have a rash. °Get help right away if: °· You have bleeding from your incision site. °· You have difficulty breathing. °· You have chest pain. °· You have severe abdominal pain. °· You have leg pain. °· You become dizzy and faint. °This information is not intended to replace advice given to you by your health care provider. Make sure you discuss any questions you have with your health care provider. °Document Released: 02/10/2013 Document Revised: 09/28/2015 Document Reviewed: 11/05/2012 °Elsevier Interactive Patient Education © 2018 [employer] ° °

## 2017-08-08 NOTE — Progress Notes (Signed)
This RN wasted 16 mL hydromorphone from patient's PCA, witnessed by Helmut Muster, RN.

## 2017-08-19 ENCOUNTER — Encounter (HOSPITAL_COMMUNITY): Payer: Self-pay | Admitting: Interventional Radiology

## 2017-09-03 ENCOUNTER — Encounter: Payer: Self-pay | Admitting: Radiology

## 2017-09-03 ENCOUNTER — Ambulatory Visit
Admission: RE | Admit: 2017-09-03 | Discharge: 2017-09-03 | Disposition: A | Discharge status: Home / Self Care | Payer: BLUE CROSS/BLUE SHIELD | Source: Ambulatory Visit | Attending: Radiology | Admitting: Radiology

## 2017-09-03 DIAGNOSIS — D259 Leiomyoma of uterus, unspecified: Secondary | ICD-10-CM | POA: Diagnosis not present

## 2017-09-03 HISTORY — PX: IR RADIOLOGIST EVAL & MGMT: IMG5224

## 2017-09-03 NOTE — Progress Notes (Signed)
Patient ID: Karla Wolf, female   DOB: 02/24/1976, 42 y.o.   MRN: [id number]       Chief Complaint:  Uterine fibroids  Referring Physician(s):  Servando Salina, MD  History of Present Illness: Karla Wolf is a 42 y.o. female with symptomatic uterine fibroids originally diagnosed 2015.  She is now 1 month status post successful uterine fibroid embolization.  Her main symptoms include menorrhagia, dysmenorrhea and dyspareunia.  Overall she has recovered very well in the last month.  No physical limitations.  She is back to work full-time.  No interval pelvic or significant abdominal pain.  Minor abdominal cramping.  No fevers.  Minor intermittent vaginal spotting but no significant bleeding.  Overall she is doing very well at 1 month post treatment.  Past Medical History:  Diagnosis Date  . Medical history non-contributory   . SVD (spontaneous vaginal delivery)    x 2  . Uterine fibroid     Past Surgical History:  Procedure Laterality Date  . IR ANGIOGRAM PELVIS SELECTIVE OR SUPRASELECTIVE  08/07/2017  . IR ANGIOGRAM PELVIS SELECTIVE OR SUPRASELECTIVE  08/07/2017  . IR ANGIOGRAM SELECTIVE EACH ADDITIONAL VESSEL  08/07/2017  . IR ANGIOGRAM SELECTIVE EACH ADDITIONAL VESSEL  08/07/2017  . IR EMBO TUMOR ORGAN ISCHEMIA INFARCT INC GUIDE ROADMAPPING  08/07/2017  . IR RADIOLOGIST EVAL & MGMT  07/17/2017  . IR RADIOLOGIST EVAL & MGMT  09/03/2017  . IR US GUIDE VASC ACCESS LEFT  08/07/2017  . IR US GUIDE VASC ACCESS RIGHT  08/07/2017  . KNEE RECONSTRUCTION  2000   right knee  . MYOMECTOMY N/A 06/03/2013   Procedure: Exploratory Laparotomy MYOMECTOMY;  Surgeon: Marvene Staff, MD;  Location: [facility];  Service: Gynecology;  Laterality: N/A;    Allergies: Patient has no known allergies.  Medications: Prior to Admission medications   Not on File     Family History  Problem Relation Age of Onset  . Hypertension Father   . Hypertension Sister   . Diabetes Sister   . Diabetes Paternal  Grandmother     Social History   Socioeconomic History  . Marital status: Legally Separated    Spouse name: Not on file  . Number of children: Not on file  . Years of education: Not on file  . Highest education level: Not on file  Occupational History  . Not on file  Social Needs  . Financial resource strain: Not on file  . Food insecurity:    Worry: Not on file    Inability: Not on file  . Transportation needs:    Medical: Not on file    Non-medical: Not on file  Tobacco Use  . Smoking status: Never Smoker  . Smokeless tobacco: Never Used  Substance and Sexual Activity  . Alcohol use: Yes    Comment: social 2/month  . Drug use: No  . Sexual activity: Yes    Birth control/protection: None  Lifestyle  . Physical activity:    Days per week: Not on file    Minutes per session: Not on file  . Stress: Not on file  Relationships  . Social connections:    Talks on phone: Not on file    Gets together: Not on file    Attends religious service: Not on file    Active member of club or organization: Not on file    Attends meetings of clubs or organizations: Not on file    Relationship status: Not on file  Other Topics Concern  .  Not on file  Social History Narrative  . Not on file      Review of Systems: A 12 point ROS discussed and pertinent positives are indicated in the HPI above.  All other systems are negative.  Review of Systems  Vital Signs: BP 115/86   Pulse 83   Temp 98.2 F (36.8 C) (Oral)   Resp 14   Ht 5' 6.75" (1.695 m)   Wt 163 lb (73.9 kg)   SpO2 100%   BMI 25.72 kg/m   Physical Exam  Constitutional: She is oriented to person, place, and time. She appears well-developed and well-nourished. No distress.  Eyes: Conjunctivae are normal.  Cardiovascular: Normal rate, regular rhythm and intact distal pulses.  Pulmonary/Chest: Effort normal and breath sounds normal.  Abdominal: Soft. Bowel sounds are normal. She exhibits no mass.  Musculoskeletal:    Intact symmetric femoral and pedal pulses.  Neurological: She is alert and oriented to person, place, and time.  Skin: She is not diaphoretic.     Imaging: Ir Angiogram Pelvis Selective Or Supraselective  Addendum Date: 08/19/2017   ADDENDUM REPORT: 08/19/2017 11:41 ADDENDUM: To the PROCEDURE: Bilateral common femoral artery access was performed with ultrasound. Ultrasound images were obtained for documentation of common femoral artery patency on both sides. Electronically Signed   By: Jerilynn Mages.  Demarrio Menges M.D.   On: 08/19/2017 11:41   Result Date: 08/19/2017 INDICATION: Uterine fibroids, abnormal menstrual bleeding EXAM: UTERINE FIBROID EMBOLIZATION Date:  08/07/2017 08/07/2017 3:47 pm Radiologist:  Jerilynn Mages. Daryll Brod, MD Guidance:  Ultrasound and fluoroscopic MEDICATIONS: Ancef 2 g. The antibiotic was administered within 1 hour of the procedure ANESTHESIA/SEDATION: Fentanyl 200 mcg IV; Versed 4 mg IV Moderate Sedation Time:  50 minutes The patient was continuously monitored during the procedure by the interventional radiology nurse under my direct supervision. CONTRAST:  110 cc Isovue-300 FLUOROSCOPY TIME:  Fluoroscopy Time: 14 minutes 12 seconds (923 mGy). COMPLICATIONS: None immediate. PROCEDURE: Informed consent was obtained from the patient following explanation of the procedure, risks, benefits and alternatives. The patient understands, agrees and consents for the procedure. All questions were addressed. A time out was performed. Maximal barrier sterile technique utilized including caps, mask, sterile gowns, sterile gloves, large sterile drape, hand hygiene, and betadine prep. Under sterile conditions and local anesthesia, bilateralcommon femoral artery access was performed with micropuncture needles. Ultrasound was utilized for access. Images were obtained for documentation. Guidewires were advanced followed by 5-French sheaths. 5-French C2 catheters were utilized to select the contralateral bilateral internal  iliac arteries. Selective internal iliac angiograms were performed. The tortuous uterine arteries were identified. Selective catheterization was performed of both uterine arteries with micro catheters and micro glide wires. Bilateral uterine angiography was performed. This demonstrated patency of the uterine arteries bilaterally with mild diffuse hypervascularity of the enlarged fibroid uterus. Bilateral access was adequate for embolization. For left uterine artery embolization, 1 and 2/3 vials of 500 - 765micron Embospheres and 1/4 of a vialof 700-900 micron Embospheres were injected into the left uterine artery. Post embolization angiogram confirms complete stasis of the left uterine vascular territory. Right uterine artery embolization: Embolization was performed to complete stasis in a similar fashion with injection of 1 and 1/3 vials of 500-700 micron Embospheres and 1/2 of a vialof 700-900 micron Embospheres. Post embolization angiogram confirms complete stasis of the right uterine vascular territory. Micro catheters and C2 catheters were removed Injection of the bilateralcommon femoral artery sheaths confirms access is adequate for ExoSeal. Hemostasis was obtained with bilateral ExoSeal devices.  No immediate complication. Peripheral pedal pulses are normal +2. The patient tolerated the procedure well. No immediate complication. IMPRESSION: Successful bilateral uterine artery embolization (U F E) Electronically Signed: By: Jerilynn Mages.  Jaionna Weisse M.D. On: 08/07/2017 16:17   Ir Angiogram Pelvis Selective Or Supraselective  Addendum Date: 08/19/2017   ADDENDUM REPORT: 08/19/2017 11:41 ADDENDUM: To the PROCEDURE: Bilateral common femoral artery access was performed with ultrasound. Ultrasound images were obtained for documentation of common femoral artery patency on both sides. Electronically Signed   By: Jerilynn Mages.  Christien Frankl M.D.   On: 08/19/2017 11:41   Result Date: 08/19/2017 INDICATION: Uterine fibroids, abnormal menstrual  bleeding EXAM: UTERINE FIBROID EMBOLIZATION Date:  08/07/2017 08/07/2017 3:47 pm Radiologist:  Jerilynn Mages. Daryll Brod, MD Guidance:  Ultrasound and fluoroscopic MEDICATIONS: Ancef 2 g. The antibiotic was administered within 1 hour of the procedure ANESTHESIA/SEDATION: Fentanyl 200 mcg IV; Versed 4 mg IV Moderate Sedation Time:  50 minutes The patient was continuously monitored during the procedure by the interventional radiology nurse under my direct supervision. CONTRAST:  110 cc Isovue-300 FLUOROSCOPY TIME:  Fluoroscopy Time: 14 minutes 12 seconds (923 mGy). COMPLICATIONS: None immediate. PROCEDURE: Informed consent was obtained from the patient following explanation of the procedure, risks, benefits and alternatives. The patient understands, agrees and consents for the procedure. All questions were addressed. A time out was performed. Maximal barrier sterile technique utilized including caps, mask, sterile gowns, sterile gloves, large sterile drape, hand hygiene, and betadine prep. Under sterile conditions and local anesthesia, bilateralcommon femoral artery access was performed with micropuncture needles. Ultrasound was utilized for access. Images were obtained for documentation. Guidewires were advanced followed by 5-French sheaths. 5-French C2 catheters were utilized to select the contralateral bilateral internal iliac arteries. Selective internal iliac angiograms were performed. The tortuous uterine arteries were identified. Selective catheterization was performed of both uterine arteries with micro catheters and micro glide wires. Bilateral uterine angiography was performed. This demonstrated patency of the uterine arteries bilaterally with mild diffuse hypervascularity of the enlarged fibroid uterus. Bilateral access was adequate for embolization. For left uterine artery embolization, 1 and 2/3 vials of 500 - 73micron Embospheres and 1/4 of a vialof 700-900 micron Embospheres were injected into the left uterine  artery. Post embolization angiogram confirms complete stasis of the left uterine vascular territory. Right uterine artery embolization: Embolization was performed to complete stasis in a similar fashion with injection of 1 and 1/3 vials of 500-700 micron Embospheres and 1/2 of a vialof 700-900 micron Embospheres. Post embolization angiogram confirms complete stasis of the right uterine vascular territory. Micro catheters and C2 catheters were removed Injection of the bilateralcommon femoral artery sheaths confirms access is adequate for ExoSeal. Hemostasis was obtained with bilateral ExoSeal devices. No immediate complication. Peripheral pedal pulses are normal +2. The patient tolerated the procedure well. No immediate complication. IMPRESSION: Successful bilateral uterine artery embolization (U F E) Electronically Signed: By: Jerilynn Mages.  Manpreet Strey M.D. On: 08/07/2017 16:17   Ir Angiogram Selective Each Additional Vessel  Addendum Date: 08/19/2017   ADDENDUM REPORT: 08/19/2017 11:41 ADDENDUM: To the PROCEDURE: Bilateral common femoral artery access was performed with ultrasound. Ultrasound images were obtained for documentation of common femoral artery patency on both sides. Electronically Signed   By: Jerilynn Mages.  Giulian Goldring M.D.   On: 08/19/2017 11:41   Result Date: 08/19/2017 INDICATION: Uterine fibroids, abnormal menstrual bleeding EXAM: UTERINE FIBROID EMBOLIZATION Date:  08/07/2017 08/07/2017 3:47 pm Radiologist:  Jerilynn Mages. Daryll Brod, MD Guidance:  Ultrasound and fluoroscopic MEDICATIONS: Ancef 2 g. The antibiotic was administered within  1 hour of the procedure ANESTHESIA/SEDATION: Fentanyl 200 mcg IV; Versed 4 mg IV Moderate Sedation Time:  50 minutes The patient was continuously monitored during the procedure by the interventional radiology nurse under my direct supervision. CONTRAST:  110 cc Isovue-300 FLUOROSCOPY TIME:  Fluoroscopy Time: 14 minutes 12 seconds (923 mGy). COMPLICATIONS: None immediate. PROCEDURE: Informed consent was  obtained from the patient following explanation of the procedure, risks, benefits and alternatives. The patient understands, agrees and consents for the procedure. All questions were addressed. A time out was performed. Maximal barrier sterile technique utilized including caps, mask, sterile gowns, sterile gloves, large sterile drape, hand hygiene, and betadine prep. Under sterile conditions and local anesthesia, bilateralcommon femoral artery access was performed with micropuncture needles. Ultrasound was utilized for access. Images were obtained for documentation. Guidewires were advanced followed by 5-French sheaths. 5-French C2 catheters were utilized to select the contralateral bilateral internal iliac arteries. Selective internal iliac angiograms were performed. The tortuous uterine arteries were identified. Selective catheterization was performed of both uterine arteries with micro catheters and micro glide wires. Bilateral uterine angiography was performed. This demonstrated patency of the uterine arteries bilaterally with mild diffuse hypervascularity of the enlarged fibroid uterus. Bilateral access was adequate for embolization. For left uterine artery embolization, 1 and 2/3 vials of 500 - 772micron Embospheres and 1/4 of a vialof 700-900 micron Embospheres were injected into the left uterine artery. Post embolization angiogram confirms complete stasis of the left uterine vascular territory. Right uterine artery embolization: Embolization was performed to complete stasis in a similar fashion with injection of 1 and 1/3 vials of 500-700 micron Embospheres and 1/2 of a vialof 700-900 micron Embospheres. Post embolization angiogram confirms complete stasis of the right uterine vascular territory. Micro catheters and C2 catheters were removed Injection of the bilateralcommon femoral artery sheaths confirms access is adequate for ExoSeal. Hemostasis was obtained with bilateral ExoSeal devices. No immediate  complication. Peripheral pedal pulses are normal +2. The patient tolerated the procedure well. No immediate complication. IMPRESSION: Successful bilateral uterine artery embolization (U F E) Electronically Signed: By: Jerilynn Mages.  Leno Mathes M.D. On: 08/07/2017 16:17   Ir Angiogram Selective Each Additional Vessel  Addendum Date: 08/19/2017   ADDENDUM REPORT: 08/19/2017 11:41 ADDENDUM: To the PROCEDURE: Bilateral common femoral artery access was performed with ultrasound. Ultrasound images were obtained for documentation of common femoral artery patency on both sides. Electronically Signed   By: Jerilynn Mages.  Leler Brion M.D.   On: 08/19/2017 11:41   Result Date: 08/19/2017 INDICATION: Uterine fibroids, abnormal menstrual bleeding EXAM: UTERINE FIBROID EMBOLIZATION Date:  08/07/2017 08/07/2017 3:47 pm Radiologist:  Jerilynn Mages. Daryll Brod, MD Guidance:  Ultrasound and fluoroscopic MEDICATIONS: Ancef 2 g. The antibiotic was administered within 1 hour of the procedure ANESTHESIA/SEDATION: Fentanyl 200 mcg IV; Versed 4 mg IV Moderate Sedation Time:  50 minutes The patient was continuously monitored during the procedure by the interventional radiology nurse under my direct supervision. CONTRAST:  110 cc Isovue-300 FLUOROSCOPY TIME:  Fluoroscopy Time: 14 minutes 12 seconds (923 mGy). COMPLICATIONS: None immediate. PROCEDURE: Informed consent was obtained from the patient following explanation of the procedure, risks, benefits and alternatives. The patient understands, agrees and consents for the procedure. All questions were addressed. A time out was performed. Maximal barrier sterile technique utilized including caps, mask, sterile gowns, sterile gloves, large sterile drape, hand hygiene, and betadine prep. Under sterile conditions and local anesthesia, bilateralcommon femoral artery access was performed with micropuncture needles. Ultrasound was utilized for access. Images were obtained for documentation. Guidewires  were advanced followed by 5-French  sheaths. 5-French C2 catheters were utilized to select the contralateral bilateral internal iliac arteries. Selective internal iliac angiograms were performed. The tortuous uterine arteries were identified. Selective catheterization was performed of both uterine arteries with micro catheters and micro glide wires. Bilateral uterine angiography was performed. This demonstrated patency of the uterine arteries bilaterally with mild diffuse hypervascularity of the enlarged fibroid uterus. Bilateral access was adequate for embolization. For left uterine artery embolization, 1 and 2/3 vials of 500 - 740micron Embospheres and 1/4 of a vialof 700-900 micron Embospheres were injected into the left uterine artery. Post embolization angiogram confirms complete stasis of the left uterine vascular territory. Right uterine artery embolization: Embolization was performed to complete stasis in a similar fashion with injection of 1 and 1/3 vials of 500-700 micron Embospheres and 1/2 of a vialof 700-900 micron Embospheres. Post embolization angiogram confirms complete stasis of the right uterine vascular territory. Micro catheters and C2 catheters were removed Injection of the bilateralcommon femoral artery sheaths confirms access is adequate for ExoSeal. Hemostasis was obtained with bilateral ExoSeal devices. No immediate complication. Peripheral pedal pulses are normal +2. The patient tolerated the procedure well. No immediate complication. IMPRESSION: Successful bilateral uterine artery embolization (U F E) Electronically Signed: By: Jerilynn Mages.  Tiesha Marich M.D. On: 08/07/2017 16:17   Ir US Guide Vasc Access Left  Addendum Date: 08/19/2017   ADDENDUM REPORT: 08/19/2017 11:41 ADDENDUM: To the PROCEDURE: Bilateral common femoral artery access was performed with ultrasound. Ultrasound images were obtained for documentation of common femoral artery patency on both sides. Electronically Signed   By: Jerilynn Mages.  Raima Geathers M.D.   On: 08/19/2017 11:41    Result Date: 08/19/2017 INDICATION: Uterine fibroids, abnormal menstrual bleeding EXAM: UTERINE FIBROID EMBOLIZATION Date:  08/07/2017 08/07/2017 3:47 pm Radiologist:  Jerilynn Mages. Daryll Brod, MD Guidance:  Ultrasound and fluoroscopic MEDICATIONS: Ancef 2 g. The antibiotic was administered within 1 hour of the procedure ANESTHESIA/SEDATION: Fentanyl 200 mcg IV; Versed 4 mg IV Moderate Sedation Time:  50 minutes The patient was continuously monitored during the procedure by the interventional radiology nurse under my direct supervision. CONTRAST:  110 cc Isovue-300 FLUOROSCOPY TIME:  Fluoroscopy Time: 14 minutes 12 seconds (923 mGy). COMPLICATIONS: None immediate. PROCEDURE: Informed consent was obtained from the patient following explanation of the procedure, risks, benefits and alternatives. The patient understands, agrees and consents for the procedure. All questions were addressed. A time out was performed. Maximal barrier sterile technique utilized including caps, mask, sterile gowns, sterile gloves, large sterile drape, hand hygiene, and betadine prep. Under sterile conditions and local anesthesia, bilateralcommon femoral artery access was performed with micropuncture needles. Ultrasound was utilized for access. Images were obtained for documentation. Guidewires were advanced followed by 5-French sheaths. 5-French C2 catheters were utilized to select the contralateral bilateral internal iliac arteries. Selective internal iliac angiograms were performed. The tortuous uterine arteries were identified. Selective catheterization was performed of both uterine arteries with micro catheters and micro glide wires. Bilateral uterine angiography was performed. This demonstrated patency of the uterine arteries bilaterally with mild diffuse hypervascularity of the enlarged fibroid uterus. Bilateral access was adequate for embolization. For left uterine artery embolization, 1 and 2/3 vials of 500 - 764micron Embospheres and 1/4 of  a vialof 700-900 micron Embospheres were injected into the left uterine artery. Post embolization angiogram confirms complete stasis of the left uterine vascular territory. Right uterine artery embolization: Embolization was performed to complete stasis in a similar fashion with injection of 1 and 1/3 vials  of 500-700 micron Embospheres and 1/2 of a vialof 700-900 micron Embospheres. Post embolization angiogram confirms complete stasis of the right uterine vascular territory. Micro catheters and C2 catheters were removed Injection of the bilateralcommon femoral artery sheaths confirms access is adequate for ExoSeal. Hemostasis was obtained with bilateral ExoSeal devices. No immediate complication. Peripheral pedal pulses are normal +2. The patient tolerated the procedure well. No immediate complication. IMPRESSION: Successful bilateral uterine artery embolization (U F E) Electronically Signed: By: Jerilynn Mages.  Kimberlea Schlag M.D. On: 08/07/2017 16:17   Ir US Guide Vasc Access Right  Addendum Date: 08/19/2017   ADDENDUM REPORT: 08/19/2017 11:41 ADDENDUM: To the PROCEDURE: Bilateral common femoral artery access was performed with ultrasound. Ultrasound images were obtained for documentation of common femoral artery patency on both sides. Electronically Signed   By: Jerilynn Mages.  Kaneesha Constantino M.D.   On: 08/19/2017 11:41   Result Date: 08/19/2017 INDICATION: Uterine fibroids, abnormal menstrual bleeding EXAM: UTERINE FIBROID EMBOLIZATION Date:  08/07/2017 08/07/2017 3:47 pm Radiologist:  Jerilynn Mages. Daryll Brod, MD Guidance:  Ultrasound and fluoroscopic MEDICATIONS: Ancef 2 g. The antibiotic was administered within 1 hour of the procedure ANESTHESIA/SEDATION: Fentanyl 200 mcg IV; Versed 4 mg IV Moderate Sedation Time:  50 minutes The patient was continuously monitored during the procedure by the interventional radiology nurse under my direct supervision. CONTRAST:  110 cc Isovue-300 FLUOROSCOPY TIME:  Fluoroscopy Time: 14 minutes 12 seconds (923 mGy).  COMPLICATIONS: None immediate. PROCEDURE: Informed consent was obtained from the patient following explanation of the procedure, risks, benefits and alternatives. The patient understands, agrees and consents for the procedure. All questions were addressed. A time out was performed. Maximal barrier sterile technique utilized including caps, mask, sterile gowns, sterile gloves, large sterile drape, hand hygiene, and betadine prep. Under sterile conditions and local anesthesia, bilateralcommon femoral artery access was performed with micropuncture needles. Ultrasound was utilized for access. Images were obtained for documentation. Guidewires were advanced followed by 5-French sheaths. 5-French C2 catheters were utilized to select the contralateral bilateral internal iliac arteries. Selective internal iliac angiograms were performed. The tortuous uterine arteries were identified. Selective catheterization was performed of both uterine arteries with micro catheters and micro glide wires. Bilateral uterine angiography was performed. This demonstrated patency of the uterine arteries bilaterally with mild diffuse hypervascularity of the enlarged fibroid uterus. Bilateral access was adequate for embolization. For left uterine artery embolization, 1 and 2/3 vials of 500 - 77micron Embospheres and 1/4 of a vialof 700-900 micron Embospheres were injected into the left uterine artery. Post embolization angiogram confirms complete stasis of the left uterine vascular territory. Right uterine artery embolization: Embolization was performed to complete stasis in a similar fashion with injection of 1 and 1/3 vials of 500-700 micron Embospheres and 1/2 of a vialof 700-900 micron Embospheres. Post embolization angiogram confirms complete stasis of the right uterine vascular territory. Micro catheters and C2 catheters were removed Injection of the bilateralcommon femoral artery sheaths confirms access is adequate for ExoSeal.  Hemostasis was obtained with bilateral ExoSeal devices. No immediate complication. Peripheral pedal pulses are normal +2. The patient tolerated the procedure well. No immediate complication. IMPRESSION: Successful bilateral uterine artery embolization (U F E) Electronically Signed: By: Jerilynn Mages.  Mckinzie Saksa M.D. On: 08/07/2017 16:17   Ir Embo Tumor Organ Ischemia Infarct Inc Guide Roadmapping  Addendum Date: 08/19/2017   ADDENDUM REPORT: 08/19/2017 11:41 ADDENDUM: To the PROCEDURE: Bilateral common femoral artery access was performed with ultrasound. Ultrasound images were obtained for documentation of common femoral artery patency on both sides. Electronically  Signed   By: Jerilynn Mages.  Keni Wafer M.D.   On: 08/19/2017 11:41   Result Date: 08/19/2017 INDICATION: Uterine fibroids, abnormal menstrual bleeding EXAM: UTERINE FIBROID EMBOLIZATION Date:  08/07/2017 08/07/2017 3:47 pm Radiologist:  Jerilynn Mages. Daryll Brod, MD Guidance:  Ultrasound and fluoroscopic MEDICATIONS: Ancef 2 g. The antibiotic was administered within 1 hour of the procedure ANESTHESIA/SEDATION: Fentanyl 200 mcg IV; Versed 4 mg IV Moderate Sedation Time:  50 minutes The patient was continuously monitored during the procedure by the interventional radiology nurse under my direct supervision. CONTRAST:  110 cc Isovue-300 FLUOROSCOPY TIME:  Fluoroscopy Time: 14 minutes 12 seconds (923 mGy). COMPLICATIONS: None immediate. PROCEDURE: Informed consent was obtained from the patient following explanation of the procedure, risks, benefits and alternatives. The patient understands, agrees and consents for the procedure. All questions were addressed. A time out was performed. Maximal barrier sterile technique utilized including caps, mask, sterile gowns, sterile gloves, large sterile drape, hand hygiene, and betadine prep. Under sterile conditions and local anesthesia, bilateralcommon femoral artery access was performed with micropuncture needles. Ultrasound was utilized for access. Images  were obtained for documentation. Guidewires were advanced followed by 5-French sheaths. 5-French C2 catheters were utilized to select the contralateral bilateral internal iliac arteries. Selective internal iliac angiograms were performed. The tortuous uterine arteries were identified. Selective catheterization was performed of both uterine arteries with micro catheters and micro glide wires. Bilateral uterine angiography was performed. This demonstrated patency of the uterine arteries bilaterally with mild diffuse hypervascularity of the enlarged fibroid uterus. Bilateral access was adequate for embolization. For left uterine artery embolization, 1 and 2/3 vials of 500 - 766micron Embospheres and 1/4 of a vialof 700-900 micron Embospheres were injected into the left uterine artery. Post embolization angiogram confirms complete stasis of the left uterine vascular territory. Right uterine artery embolization: Embolization was performed to complete stasis in a similar fashion with injection of 1 and 1/3 vials of 500-700 micron Embospheres and 1/2 of a vialof 700-900 micron Embospheres. Post embolization angiogram confirms complete stasis of the right uterine vascular territory. Micro catheters and C2 catheters were removed Injection of the bilateralcommon femoral artery sheaths confirms access is adequate for ExoSeal. Hemostasis was obtained with bilateral ExoSeal devices. No immediate complication. Peripheral pedal pulses are normal +2. The patient tolerated the procedure well. No immediate complication. IMPRESSION: Successful bilateral uterine artery embolization (U F E) Electronically Signed: By: Jerilynn Mages.  Karalynn Cottone M.D. On: 08/07/2017 16:17   Ir Radiologist Eval & Mgmt  Result Date: 09/03/2017 Please refer to notes tab for details about interventional procedure. (Op Note)   Labs:  CBC: Recent Labs    08/07/17 1205  WBC 6.8  HGB 13.0  HCT 39.9  PLT 335    COAGS: Recent Labs    08/07/17 1205  INR 0.96     BMP: No results for input(s): NA, K, CL, CO2, GLUCOSE, BUN, CALCIUM, CREATININE, GFRNONAA, GFRAA in the last 8760 hours.  Invalid input(s): CMP  LIVER FUNCTION TESTS: No results for input(s): BILITOT, AST, ALT, ALKPHOS, PROT, ALBUMIN in the last 8760 hours.  TUMOR MARKERS: No results for input(s): AFPTM, CEA, CA199, CHROMGRNA in the last 8760 hours.  Assessment and Plan:  Doing well 1 month status post uterine fibroid embolization for systematic uterine fibroids.  No delayed complication.  Treatment imaging reviewed with the patient today directly on her monitor.  All questions addressed.  She has a clear understanding of the treatment and recovery as well as expected goals and outcomes.  Plan for phone  follow-up in 3 months and repeat MRI 6 months posttreatment.    Electronically Signed: Greggory Keen 09/03/2017, 9:47 AM   I spent a total of    25 Minutes in face to face in clinical consultation, greater than 50% of which was counseling/coordinating care for this patient was symptomatically uterine fibroids.

## 2017-10-17 DIAGNOSIS — R7989 Other specified abnormal findings of blood chemistry: Secondary | ICD-10-CM | POA: Diagnosis not present

## 2017-10-17 DIAGNOSIS — R6889 Other general symptoms and signs: Secondary | ICD-10-CM | POA: Diagnosis not present

## 2017-10-23 DIAGNOSIS — Z0000 Encounter for general adult medical examination without abnormal findings: Secondary | ICD-10-CM | POA: Diagnosis not present

## 2017-10-23 DIAGNOSIS — R6889 Other general symptoms and signs: Secondary | ICD-10-CM | POA: Diagnosis not present

## 2017-10-23 DIAGNOSIS — R7989 Other specified abnormal findings of blood chemistry: Secondary | ICD-10-CM | POA: Diagnosis not present

## 2017-10-27 ENCOUNTER — Emergency Department (HOSPITAL_COMMUNITY)
Admission: EM | Admit: 2017-10-27 | Discharge: 2017-10-28 | Disposition: A | Discharge status: Home / Self Care | Payer: BLUE CROSS/BLUE SHIELD | Attending: Emergency Medicine | Admitting: Emergency Medicine

## 2017-10-27 ENCOUNTER — Encounter (HOSPITAL_COMMUNITY): Payer: Self-pay | Admitting: Emergency Medicine

## 2017-10-27 DIAGNOSIS — M791 Myalgia, unspecified site: Secondary | ICD-10-CM | POA: Diagnosis not present

## 2017-10-27 DIAGNOSIS — R197 Diarrhea, unspecified: Secondary | ICD-10-CM | POA: Diagnosis not present

## 2017-10-27 DIAGNOSIS — R1084 Generalized abdominal pain: Secondary | ICD-10-CM | POA: Diagnosis not present

## 2017-10-27 DIAGNOSIS — R110 Nausea: Secondary | ICD-10-CM | POA: Diagnosis not present

## 2017-10-27 DIAGNOSIS — R52 Pain, unspecified: Secondary | ICD-10-CM

## 2017-10-27 LAB — CBC
HCT: 38.6 % (ref 36.0–46.0)
Hemoglobin: 12.7 g/dL (ref 12.0–15.0)
MCH: 27.1 pg (ref 26.0–34.0)
MCHC: 32.9 g/dL (ref 30.0–36.0)
MCV: 82.3 fL (ref 78.0–100.0)
PLATELETS: 318 10*3/uL (ref 150–400)
RBC: 4.69 MIL/uL (ref 3.87–5.11)
RDW: 14.6 % (ref 11.5–15.5)
WBC: 6.8 10*3/uL (ref 4.0–10.5)

## 2017-10-27 LAB — COMPREHENSIVE METABOLIC PANEL
ALT: 15 U/L (ref 14–54)
AST: 17 U/L (ref 15–41)
Albumin: 3.8 g/dL (ref 3.5–5.0)
Alkaline Phosphatase: 67 U/L (ref 38–126)
Anion gap: 8 (ref 5–15)
BUN: 14 mg/dL (ref 6–20)
CHLORIDE: 102 mmol/L (ref 101–111)
CO2: 26 mmol/L (ref 22–32)
Calcium: 9 mg/dL (ref 8.9–10.3)
Creatinine, Ser: 0.95 mg/dL (ref 0.44–1.00)
GFR calc Af Amer: >60 mL/min (ref >60)
Glucose, Bld: 86 mg/dL (ref 65–99)
Potassium: 3.7 mmol/L (ref 3.5–5.1)
SODIUM: 136 mmol/L (ref 135–145)
Total Bilirubin: 0.3 mg/dL (ref 0.3–1.2)
Total Protein: 7.5 g/dL (ref 6.5–8.1)

## 2017-10-27 LAB — URINALYSIS, ROUTINE W REFLEX MICROSCOPIC
Bilirubin Urine: NEGATIVE
Glucose, UA: NEGATIVE mg/dL
Ketones, ur: NEGATIVE mg/dL
Nitrite: NEGATIVE
PH: 5 (ref 5.0–8.0)
Protein, ur: NEGATIVE mg/dL
SPECIFIC GRAVITY, URINE: 1.008 (ref 1.005–1.030)

## 2017-10-27 LAB — LIPASE, BLOOD: LIPASE: 32 U/L (ref 11–51)

## 2017-10-27 LAB — I-STAT BETA HCG BLOOD, ED (MC, WL, AP ONLY): I-stat hCG, quantitative: <5 m[IU]/mL (ref <5)

## 2017-10-27 MED ORDER — ONDANSETRON 4 MG PO TBDP
4.0000 mg | ORAL_TABLET | Freq: Once | ORAL | Status: AC
  Administered 2017-10-27: 4 mg via ORAL
  Filled 2017-10-27: qty 1

## 2017-10-27 MED ORDER — SODIUM CHLORIDE 0.9 % IV BOLUS
1000.0000 mL | Freq: Once | INTRAVENOUS | Status: AC
  Administered 2017-10-27: 1000 mL via INTRAVENOUS

## 2017-10-27 MED ORDER — LOPERAMIDE HCL 2 MG PO CAPS
4.0000 mg | ORAL_CAPSULE | Freq: Once | ORAL | Status: AC
  Administered 2017-10-27: 4 mg via ORAL
  Filled 2017-10-27: qty 2

## 2017-10-27 NOTE — ED Notes (Signed)
Pt is aware a urine sample is needed, but is unable to provide one at this time as she has just recently voided bladder. Specimen cup was provided with instructions.

## 2017-10-27 NOTE — ED Notes (Signed)
Attempted to place IV and draw labs. Unsuccessful. Notified IV team

## 2017-10-27 NOTE — ED Notes (Signed)
Blood draw unsuccessful 

## 2017-10-27 NOTE — ED Triage Notes (Signed)
Pt c/o nausea and body aches that started couple days ago. Diarrhea started yesterday. denies vomting or urinary problems. LBM today.

## 2017-10-27 NOTE — ED Provider Notes (Signed)
[facility] DEPT Provider Note   CSN: [id number] Arrival date & time: 10/27/17  1535     History   Chief Complaint Chief Complaint  Patient presents with  . Nausea  . Diarrhea  . Generalized Body Aches    HPI Karla Wolf is a 42 y.o. female with history of uterine fibroids presents for evaluation of acute onset, progressively worsening generalized myalgias for 3 days, nausea, and diarrhea  since yesterday.  She states that 3 days ago she woke "not feeling well".  She has experienced "countless " episodes of watery nonbloody diarrhea since yesterday.  3 days ago she did participate in a cookout at work and is not sure if this may be contributing to her symptoms.  She endorses nausea but no vomiting.  She endorses intermittent generalized sharp abdominal pain which usually starts in the left side of the abdomen and radiates to the right side.  Pain will last for a few minutes before resolving.  No aggravating or alleviating factors noted.  She has tried wine and ginger ale without relief of her symptoms.  Denies fevers or chills, no chest pain or shortness of breath.  She denies urinary symptoms, melena, hematochezia, or abnormal vaginal itching, bleeding, or discharge. The history is provided by the patient.    Past Medical History:  Diagnosis Date  . Medical history non-contributory   . SVD (spontaneous vaginal delivery)    x 2  . Uterine fibroid     Patient Active Problem List   Diagnosis Date Noted  . Fibroids, submucosal 08/07/2017  . S/P myomectomy 06/03/2013    Past Surgical History:  Procedure Laterality Date  . IR ANGIOGRAM PELVIS SELECTIVE OR SUPRASELECTIVE  08/07/2017  . IR ANGIOGRAM PELVIS SELECTIVE OR SUPRASELECTIVE  08/07/2017  . IR ANGIOGRAM SELECTIVE EACH ADDITIONAL VESSEL  08/07/2017  . IR ANGIOGRAM SELECTIVE EACH ADDITIONAL VESSEL  08/07/2017  . IR EMBO TUMOR ORGAN ISCHEMIA INFARCT INC GUIDE ROADMAPPING  08/07/2017  . IR RADIOLOGIST  EVAL & MGMT  07/17/2017  . IR RADIOLOGIST EVAL & MGMT  09/03/2017  . IR US GUIDE VASC ACCESS LEFT  08/07/2017  . IR US GUIDE VASC ACCESS RIGHT  08/07/2017  . KNEE RECONSTRUCTION  2000   right knee  . MYOMECTOMY N/A 06/03/2013   Procedure: Exploratory Laparotomy MYOMECTOMY;  Surgeon: Marvene Staff, MD;  Location: [facility];  Service: Gynecology;  Laterality: N/A;     OB History    Gravida  1   Para  1   Term  1   Preterm      AB      Living  1     SAB      TAB      Ectopic      Multiple      Live Births  1            Home Medications    Prior to Admission medications   Medication Sig Start Date End Date Taking? Authorizing Provider  loperamide (IMODIUM) 2 MG capsule Take 1 capsule (2 mg total) by mouth as needed for diarrhea or loose stools. 10/28/17   Nils Flack, Gabrien Mentink A, PA-C  ondansetron (ZOFRAN) 4 MG tablet Take 1 tablet (4 mg total) by mouth every 8 (eight) hours as needed for nausea or vomiting. 10/28/17   Renita Papa, PA-C    Family History Family History  Problem Relation Age of Onset  . Hypertension Father   . Hypertension Sister   .  Diabetes Sister   . Diabetes Paternal Grandmother     Social History Social History   Tobacco Use  . Smoking status: Never Smoker  . Smokeless tobacco: Never Used  Substance Use Topics  . Alcohol use: Yes    Comment: social 2/month  . Drug use: No     Allergies   Patient has no known allergies.   Review of Systems Review of Systems  Constitutional: Negative for chills and fever.  Respiratory: Negative for shortness of breath.   Cardiovascular: Negative for chest pain.  Gastrointestinal: Positive for abdominal pain, diarrhea and nausea. Negative for blood in stool, constipation and vomiting.  Genitourinary: Negative for dysuria, frequency, hematuria, urgency, vaginal bleeding, vaginal discharge and vaginal pain.  Musculoskeletal: Positive for myalgias.  Neurological: Negative for syncope.  All other systems  reviewed and are negative.    Physical Exam Updated Vital Signs BP 125/79 (BP Location: Right Arm)   Pulse (!) 52   Temp 97.9 F (36.6 C) (Oral)   Resp 20   Ht 5' 6.75" (1.695 m)   Wt 76.2 kg (168 lb)   LMP 10/04/2017   SpO2 100%   BMI 26.51 kg/m   Physical Exam  Constitutional: She appears well-developed and well-nourished. No distress.  Resting comfortably in bed  HENT:  Head: Normocephalic and atraumatic.  Eyes: Conjunctivae are normal. Right eye exhibits no discharge. Left eye exhibits no discharge.  Neck: No JVD present. No tracheal deviation present.  Cardiovascular: Normal rate, regular rhythm and normal heart sounds.  Pulmonary/Chest: Effort normal and breath sounds normal.  Abdominal: Soft. Bowel sounds are normal. She exhibits no distension and no mass. There is tenderness. There is no rebound and no guarding.  Diffuse tenderness to palpation of the abdomen with no focal tenderness.  Murphy sign absent, Rovsing's absent, no CVA tenderness. Psoas sign absent  Musculoskeletal: She exhibits no edema.  Neurological: She is alert.  Skin: Skin is warm and dry. No erythema.  Psychiatric: She has a normal mood and affect. Her behavior is normal.  Nursing note and vitals reviewed.    ED Treatments / Results  Labs (all labs ordered are listed, but only abnormal results are displayed) Labs Reviewed  URINALYSIS, ROUTINE W REFLEX MICROSCOPIC - Abnormal; Notable for the following components:      Result Value   Hgb urine dipstick SMALL (*)    Leukocytes, UA TRACE (*)    Bacteria, UA RARE (*)    All other components within normal limits  GASTROINTESTINAL PANEL BY PCR, STOOL (REPLACES STOOL CULTURE)  LIPASE, BLOOD  COMPREHENSIVE METABOLIC PANEL  CBC  I-STAT BETA HCG BLOOD, ED ([facility], [facility], [facility] ONLY)    EKG None  Radiology No results found.  Procedures Procedures (including critical care time)  Medications Ordered in ED Medications  sodium chloride 0.9 % bolus  1,000 mL (0 mLs Intravenous Stopped 10/28/17 0025)  ondansetron (ZOFRAN-ODT) disintegrating tablet 4 mg (4 mg Oral Given 10/27/17 2243)  loperamide (IMODIUM) capsule 4 mg (4 mg Oral Given 10/27/17 2243)     Initial Impression / Assessment and Plan / ED Course  I have reviewed the triage vital signs and the nursing notes.  Pertinent labs & imaging results that were available during my care of the patient were reviewed by me and considered in my medical decision making (see chart for details).     Patient presents for evaluation of generalized myalgias and nausea for 3 days with diarrhea beginning yesterday after a cookout at work 3  days ago.  She is afebrile, mildly bradycardic while in the ED but chart review shows that this is sometimes her baseline.  She is nontoxic in appearance.  Abdomen is soft, generally tender with no focal tenderness, no peritoneal signs.  No evidence of acute abdomen.  Lab work reviewed by me shows no leukocytosis, no anemia, no electrolyte abnormalities.  LFTs, lipase, and creatinine are all within normal limits.  UA is not concerning for UTI or nephrolithiasis.  She is not pregnant.  I doubt ectopic pregnancy, PID, ovarian torsion, or TOA in the absence of GU complaints.  I doubt obstruction, perforation, appendicitis, colitis, dissection, or other acute surgical abdominal pathology.  I do not see a need for emergent imaging of the abdomen or pelvis at this time.  She was given IV fluids, oral Zofran and Imodium in the ED.  On reevaluation she is resting comfortably and states she is feeling much better.  She is tolerating p.o. food and fluids in the ED, ambulatory with out difficulty.  Serial abdominal examinations remain benign.  She is stable for discharge home with Zofran and Imodium for symptomatic relief, instructions to advance diet slowly.  Recommend follow-up with PCP if symptoms persist.  Discussed strict ED return precautions. Pt verbalized understanding of and  agreement with plan and is safe for discharge home at this time.   Final Clinical Impressions(s) / ED Diagnoses   Final diagnoses:  Nausea  Diarrhea of presumed infectious origin  Generalized abdominal pain  Generalized body aches    ED Discharge Orders        Ordered    loperamide (IMODIUM) 2 MG capsule  As needed,   Status:  Discontinued     10/28/17 0008    ondansetron (ZOFRAN ODT) 4 MG disintegrating tablet  Every 8 hours PRN,   Status:  Discontinued     10/28/17 0008    ondansetron (ZOFRAN) 4 MG tablet  Every 8 hours PRN     10/28/17 0009    loperamide (IMODIUM) 2 MG capsule  As needed     10/28/17 0009       Renita Papa, PA-C 10/28/17 1722    Dorie Rank, MD 10/28/17 2342

## 2017-10-28 MED ORDER — ONDANSETRON HCL 4 MG PO TABS: 4.0000 mg | ORAL_TABLET | Freq: Three times a day (TID) | ORAL | 0 refills | Status: DC | PRN

## 2017-10-28 MED ORDER — LOPERAMIDE HCL 2 MG PO CAPS: 2.0000 mg | ORAL_CAPSULE | ORAL | 0 refills | Status: DC | PRN

## 2017-10-28 MED ORDER — ONDANSETRON 4 MG PO TBDP: 4.0000 mg | ORAL_TABLET | Freq: Three times a day (TID) | ORAL | 0 refills | Status: DC | PRN

## 2017-10-28 NOTE — Discharge Instructions (Addendum)
1. Medications: Take Zofran as needed for nausea.  Wait around 20 minutes before eating or drinking after taking this medication.  If the diarrhea is very bothersome, you can take over-the-counter Imodium.  Do not exceed more than 8 capsules of Imodium daily. 2. Treatment: rest, drink plenty of fluids, advance diet slowly.  Start with water and broth then advance to bland foods that will not upset your stomach such as crackers, mashed potatoes, and peanut butter. 3. Follow Up: Please followup with your primary doctor in 3 days for discussion of your diagnoses and further evaluation after today's visit; if you do not have a primary care doctor use the resource guide provided to find one; Please return to the ER for persistent vomiting, high fevers or worsening symptoms

## 2017-11-27 DIAGNOSIS — R6889 Other general symptoms and signs: Secondary | ICD-10-CM | POA: Diagnosis not present

## 2017-11-27 DIAGNOSIS — E782 Mixed hyperlipidemia: Secondary | ICD-10-CM | POA: Diagnosis not present

## 2018-02-02 ENCOUNTER — Other Ambulatory Visit (HOSPITAL_COMMUNITY): Payer: Self-pay | Admitting: Interventional Radiology

## 2018-02-02 ENCOUNTER — Other Ambulatory Visit: Payer: Self-pay | Admitting: Obstetrics and Gynecology

## 2018-02-02 DIAGNOSIS — D219 Benign neoplasm of connective and other soft tissue, unspecified: Secondary | ICD-10-CM

## 2018-02-03 ENCOUNTER — Encounter: Payer: Self-pay | Admitting: Radiology

## 2018-03-05 ENCOUNTER — Other Ambulatory Visit: Payer: BLUE CROSS/BLUE SHIELD

## 2018-03-07 ENCOUNTER — Ambulatory Visit
Admission: RE | Admit: 2018-03-07 | Discharge: 2018-03-07 | Disposition: A | Discharge status: Home / Self Care | Payer: BLUE CROSS/BLUE SHIELD | Source: Ambulatory Visit | Attending: Obstetrics and Gynecology | Admitting: Obstetrics and Gynecology

## 2018-03-07 DIAGNOSIS — D259 Leiomyoma of uterus, unspecified: Secondary | ICD-10-CM | POA: Diagnosis not present

## 2018-03-07 DIAGNOSIS — D219 Benign neoplasm of connective and other soft tissue, unspecified: Secondary | ICD-10-CM

## 2018-03-07 MED ORDER — GADOBENATE DIMEGLUMINE 529 MG/ML IV SOLN
15.0000 mL | Freq: Once | INTRAVENOUS | Status: AC | PRN
  Administered 2018-03-07: 15 mL via INTRAVENOUS

## 2018-03-18 ENCOUNTER — Ambulatory Visit
Admission: RE | Admit: 2018-03-18 | Discharge: 2018-03-18 | Disposition: A | Discharge status: Home / Self Care | Payer: BLUE CROSS/BLUE SHIELD | Source: Ambulatory Visit | Attending: Interventional Radiology | Admitting: Interventional Radiology

## 2018-03-18 ENCOUNTER — Encounter: Payer: Self-pay | Admitting: Radiology

## 2018-03-18 DIAGNOSIS — D259 Leiomyoma of uterus, unspecified: Secondary | ICD-10-CM | POA: Diagnosis not present

## 2018-03-18 DIAGNOSIS — D219 Benign neoplasm of connective and other soft tissue, unspecified: Secondary | ICD-10-CM

## 2018-03-18 HISTORY — PX: IR RADIOLOGIST EVAL & MGMT: IMG5224

## 2018-03-18 NOTE — Progress Notes (Signed)
Patient ID: Karla Wolf, female   DOB: 1975/09/09, 42 y.o.   MRN: [id number]       Chief Complaint:  56-month status post uterine fibroid embolization procedure  Referring Physician(s): Dr. Garwin Brothers  History of Present Illness: Karla Wolf is a 42 y.o. female who presented with cinematic uterine fibroids originally diagnosed 52.  She reported symptoms including menorrhagia, dysmenorrhea, and dyspareunia.  She underwent successful uterine fibroid embolization in April 2019.  She has recovered from the procedure very well.  No physical limitations.  She is back to work full-time.  Her menorrhagia, pelvic pain and other related symptoms have resolved.  She currently is asymptomatic.  She reports significant reduction in menstrual flow.  No further menstrual cramps are associated pelvic pain.  No significant intraperitoneal bleeding.  No new issues.  Past Medical History:  Diagnosis Date  . Medical history non-contributory   . SVD (spontaneous vaginal delivery)    x 2  . Uterine fibroid     Past Surgical History:  Procedure Laterality Date  . IR ANGIOGRAM PELVIS SELECTIVE OR SUPRASELECTIVE  08/07/2017  . IR ANGIOGRAM PELVIS SELECTIVE OR SUPRASELECTIVE  08/07/2017  . IR ANGIOGRAM SELECTIVE EACH ADDITIONAL VESSEL  08/07/2017  . IR ANGIOGRAM SELECTIVE EACH ADDITIONAL VESSEL  08/07/2017  . IR EMBO TUMOR ORGAN ISCHEMIA INFARCT INC GUIDE ROADMAPPING  08/07/2017  . IR RADIOLOGIST EVAL & MGMT  07/17/2017  . IR RADIOLOGIST EVAL & MGMT  09/03/2017  . IR RADIOLOGIST EVAL & MGMT  03/18/2018  . IR US GUIDE VASC ACCESS LEFT  08/07/2017  . IR US GUIDE VASC ACCESS RIGHT  08/07/2017  . KNEE RECONSTRUCTION  2000   right knee  . MYOMECTOMY N/A 06/03/2013   Procedure: Exploratory Laparotomy MYOMECTOMY;  Surgeon: Marvene Staff, MD;  Location: [facility];  Service: Gynecology;  Laterality: N/A;    Allergies: Patient has no known allergies.  Medications: Prior to Admission medications   Not on File      Family History  Problem Relation Age of Onset  . Hypertension Father   . Hypertension Sister   . Diabetes Sister   . Diabetes Paternal Grandmother     Social History   Socioeconomic History  . Marital status: Legally Separated    Spouse name: Not on file  . Number of children: Not on file  . Years of education: Not on file  . Highest education level: Not on file  Occupational History  . Not on file  Social Needs  . Financial resource strain: Not on file  . Food insecurity:    Worry: Not on file    Inability: Not on file  . Transportation needs:    Medical: Not on file    Non-medical: Not on file  Tobacco Use  . Smoking status: Never Smoker  . Smokeless tobacco: Never Used  Substance and Sexual Activity  . Alcohol use: Yes    Comment: social 2/month  . Drug use: No  . Sexual activity: Yes    Birth control/protection: None  Lifestyle  . Physical activity:    Days per week: Not on file    Minutes per session: Not on file  . Stress: Not on file  Relationships  . Social connections:    Talks on phone: Not on file    Gets together: Not on file    Attends religious service: Not on file    Active member of club or organization: Not on file    Attends meetings of clubs or organizations:  Not on file    Relationship status: Not on file  Other Topics Concern  . Not on file  Social History Narrative  . Not on file     Review of Systems: A 12 point ROS discussed and pertinent positives are indicated in the HPI above.  All other systems are negative.  Review of Systems  Vital Signs: BP (!) 145/90   Pulse 62   Temp 97.9 F (36.6 C) (Oral)   Resp 16   Ht 5' 6.75" (1.695 m)   Wt 76.2 kg   LMP 03/16/2018   SpO2 99%   BMI 26.51 kg/m   Physical Exam Deferred today  Imaging: Mr Pelvis W Wo Contrast  Result Date: 03/08/2018 CLINICAL DATA:  Post uterine artery embolization. Uterine artery embolization 08/07/2017 EXAM: MRI PELVIS WITHOUT AND WITH CONTRAST  TECHNIQUE: Multiplanar multisequence MR imaging of the pelvis was performed both before and after administration of intravenous contrast. CONTRAST:  69mL MULTIHANCE GADOBENATE DIMEGLUMINE 529 MG/ML IV SOLN COMPARISON:  Pelvic MRI 07/18/2017 FINDINGS: Urinary Tract: Bowel: Vascular/Lymphatic: Reproductive: Uterus: Measures 7.7 by 5.8 by 6.1 cm (volume = 140 cm^3)which compares to 9.2 x 6.4 by 8.0 cm (volume = 250 cm^3) . Individual leiomyoma are  decreased in size. For example: - leiomyoma in the posterior wall of the uterus measures 1.5 1.7 cm decreased from 2.3 x 2.7 cm (image 16/3). - leiomyoma the posterior wall the uterus measures 1 cm compared with 1.3 cm (image 14/3) The junctional zone appears reduced thickness. The endometrial canal is normal at 5 mm. Postcontrast imaging demonstrates regions of hypoperfusion within the uterus which are new. Again demonstrated moderate hydrosalpinx on the which is increased compared to prior. For example fallopian tube measures 15 mm (image 9/5) increased from 12 mm on prior. There is now new mild hydro salpinx on the RIGHT also increased with fallopian tube measuring 12 mm compared with 6 mm. Right ovary: Normal measuring 1.8 x 1.5 cm (image 13/5). Left ovary:  Difficult to identify Other: Small amount free fluid the pelvis is non complicated Musculoskeletal:  No aggressive osseous lesion IMPRESSION: 1. Interval decrease in volume of the uterus following uterine artery embolization. 2. Interval decrease in individual leiomyoma within the body uterus. 3. Interval increase in bilateral hydrosalpinx (mild on the RIGHT and moderate on the LEFT). Electronically Signed   By: Suzy Bouchard M.D.   On: 03/08/2018 15:58   Ir Radiologist Eval & Mgmt  Result Date: 03/18/2018 Please refer to notes tab for details about interventional procedure. (Op Note)   Labs:  CBC: Recent Labs    08/07/17 1205 10/27/17 2218  WBC 6.8 6.8  HGB 13.0 12.7  HCT 39.9 38.6  PLT 335 318      COAGS: Recent Labs    08/07/17 1205  INR 0.96    BMP: Recent Labs    10/27/17 2218  NA 136  K 3.7  CL 102  CO2 26  GLUCOSE 86  BUN 14  CALCIUM 9.0  CREATININE 0.95  GFRNONAA >60  GFRAA >60    LIVER FUNCTION TESTS: Recent Labs    10/27/17 2218  BILITOT 0.3  AST 17  ALT 15  ALKPHOS 67  PROT 7.5  ALBUMIN 3.8     Assessment and Plan:  61-month status post uterine fibroid embolization for systematic uterine fibroids.  Her menorrhagia, pelvic pain, dyspareunia have resolved following the procedure.  She currently is asymptomatic.  Review of her MRI demonstrates reduction in overall uterine volume.  All  fibroids are smaller without any residual enhancement.  MRI findings are concordant with her clinical result.  Plan: Continue annual follow-up with Dr. Garwin Brothers.  Follow-up with interventional as needed.  Thank you for this interesting consult.  I greatly enjoyed meeting Karla Wolf and look forward to participating in their care.  A copy of this report was sent to the requesting provider on this date.  Electronically Signed: Greggory Keen 03/18/2018, 4:09 PM   I spent a total of    25 Minutes in face to face in clinical consultation, greater than 50% of which was counseling/coordinating care for this patient with uterine fibroids.

## 2018-05-08 ENCOUNTER — Encounter: Payer: Self-pay | Admitting: Gastroenterology

## 2018-06-03 ENCOUNTER — Encounter: Payer: Self-pay | Admitting: Gastroenterology

## 2018-06-03 ENCOUNTER — Ambulatory Visit: Payer: BLUE CROSS/BLUE SHIELD | Admitting: Gastroenterology

## 2018-06-03 VITALS — BP 124/78 | HR 62 | Ht 66.75 in | Wt 169.0 lb

## 2018-06-03 DIAGNOSIS — Z800 Family history of malignant neoplasm of digestive organs: Secondary | ICD-10-CM

## 2018-06-03 MED ORDER — NA SULFATE-K SULFATE-MG SULF 17.5-3.13-1.6 GM/177ML PO SOLN: 1.0000 | ORAL | 0 refills | Status: DC

## 2018-06-03 NOTE — Patient Instructions (Signed)

## 2018-06-03 NOTE — Progress Notes (Signed)
Referring Provider: No ref. provider found Primary Care Physician:  Patient, No Pcp Per   Reason for Consultation: Wants to discuss colon cancer screening   IMPRESSION:  Father with colon cancer at age 43  PLAN: Colonoscopy  I consented the patient at the bedside today discussing the risks, benefits, and alternatives to endoscopic evaluation. In particular, we discussed the risks that include, but are not limited to, reaction to medication, cardiopulmonary compromise, bleeding requiring blood transfusion, aspiration resulting in pneumonia, perforation requiring surgery, lack of diagnosis, severe illness requiring hospitalization, and even death. We reviewed the risk of missed lesion including polyps or even cancer. The patient acknowledges these risks and asks that we proceed.   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  The history is obtained through the patient and review of her electronic health record. History of uterine fibroids, status post successful bilateral uterine artery embolization on 08/07/17.   Father with colon cancer in 55s when he presented with blood in his stool. He had surgery. No chemotherapy required. Her insurance company recommended that she had a colonoscopy early given the family history.  No other known family history of colon cancer or polyps.   No GI symptoms.   Past Medical History:  Diagnosis Date  . Medical history non-contributory   . SVD (spontaneous vaginal delivery)    x 2  . Uterine fibroid     Past Surgical History:  Procedure Laterality Date  . IR ANGIOGRAM PELVIS SELECTIVE OR SUPRASELECTIVE  08/07/2017  . IR ANGIOGRAM PELVIS SELECTIVE OR SUPRASELECTIVE  08/07/2017  . IR ANGIOGRAM SELECTIVE EACH ADDITIONAL VESSEL  08/07/2017  . IR ANGIOGRAM SELECTIVE EACH ADDITIONAL VESSEL  08/07/2017  . IR EMBO TUMOR ORGAN ISCHEMIA INFARCT INC GUIDE ROADMAPPING  08/07/2017  . IR RADIOLOGIST  EVAL & MGMT  07/17/2017  . IR RADIOLOGIST EVAL & MGMT  09/03/2017  . IR RADIOLOGIST EVAL & MGMT  03/18/2018  . IR US GUIDE VASC ACCESS LEFT  08/07/2017  . IR US GUIDE VASC ACCESS RIGHT  08/07/2017  . KNEE RECONSTRUCTION  2000   right knee  . MYOMECTOMY N/A 06/03/2013   Procedure: Exploratory Laparotomy MYOMECTOMY;  Surgeon: Marvene Staff, MD;  Location: [facility];  Service: Gynecology;  Laterality: N/A;    Current Outpatient Medications  Medication Sig Dispense Refill  . Na Sulfate-K Sulfate-Mg Sulf 17.5-3.13-1.6 GM/177ML SOLN Take 1 kit by mouth as directed for 30 days. 354 mL 0   No current facility-administered medications for this visit.     Allergies as of 06/03/2018  . (No Known Allergies)    Family History  Problem Relation Age of Onset  . Hypertension Father   . Colon cancer Father        in his 49's  . Hypertension Sister   . Diabetes Sister   . Diabetes Paternal Grandmother     Social History   Socioeconomic History  . Marital status: Legally Separated    Spouse name: Not on file  . Number of children: Not on file  . Years of education: Not on file  . Highest education level: Not on file  Occupational History  . Not on file  Social Needs  . Financial resource strain: Not on file  . Food insecurity:    Worry: Not on file    Inability: Not on file  . Transportation needs:    Medical: Not on file    Non-medical: Not on file  Tobacco Use  . Smoking status: Never Smoker  . Smokeless tobacco: Never Used  Substance and Sexual Activity  . Alcohol use: Yes    Comment: social 2/month  . Drug use: No  . Sexual activity: Yes    Birth control/protection: None  Lifestyle  . Physical activity:    Days per week: Not on file    Minutes per session: Not on file  . Stress: Not on file  Relationships  . Social connections:    Talks on phone: Not on file    Gets together: Not on file    Attends religious service: Not on file    Active member of club or  organization: Not on file    Attends meetings of clubs or organizations: Not on file    Relationship status: Not on file  . Intimate partner violence:    Fear of current or ex partner: Not on file    Emotionally abused: Not on file    Physically abused: Not on file    Forced sexual activity: Not on file  Other Topics Concern  . Not on file  Social History Narrative  . Not on file    Review of Systems: 12 system ROS is negative except as noted above.  Filed Weights   06/03/18 1420  Weight: 169 lb (76.7 kg)    Physical Exam: Vital signs were reviewed. General:   Alert, well-nourished, pleasant and cooperative in NAD Head:  Normocephalic and atraumatic. Eyes:  Sclera clear, no icterus.   Conjunctiva pink. Mouth:  No deformity or lesions.   Neck:  Supple; no thyromegaly. Lungs:  Clear throughout to auscultation.   No wheezes. Heart:  Regular rate and rhythm; no murmurs Abdomen:  Soft, nontender, normal bowel sounds. No rebound or guarding. No hepatosplenomegaly Rectal:  Deferred  Msk:  Symmetrical without gross deformities. Extremities:  No gross deformities or edema. Neurologic:  Alert and  oriented x4;  grossly nonfocal Skin:  No rash or bruise. Psych:  Alert and cooperative. Normal mood and affect.   Genine Beckett L. Tarri Glenn, MD, MPH [facility] 06/03/2018, 3:26 PM

## 2018-06-08 ENCOUNTER — Encounter: Payer: BLUE CROSS/BLUE SHIELD | Admitting: Gastroenterology

## 2018-06-10 ENCOUNTER — Encounter: Payer: BLUE CROSS/BLUE SHIELD | Admitting: Gastroenterology

## 2018-06-18 ENCOUNTER — Telehealth: Payer: Self-pay | Admitting: Gastroenterology

## 2018-06-18 MED ORDER — NA SULFATE-K SULFATE-MG SULF 17.5-3.13-1.6 GM/177ML PO SOLN: 1.0000 | ORAL | 0 refills | Status: AC

## 2018-06-18 NOTE — Telephone Encounter (Signed)
rx sent to pharmacy

## 2018-06-25 ENCOUNTER — Encounter: Payer: BLUE CROSS/BLUE SHIELD | Admitting: Gastroenterology

## 2018-06-29 ENCOUNTER — Telehealth: Payer: Self-pay | Admitting: Gastroenterology

## 2018-06-29 NOTE — Telephone Encounter (Signed)
No charge. Thank you 

## 2018-06-29 NOTE — Telephone Encounter (Signed)
Karla Wolf from front desk called to confirm patient appointment for tomorrow. Pt states she needs to cancel and will call back to reschedule, though she did not offer a reason as to why she needed to cancel.  Dr. Tarri Glenn, do you wish for this patient to be charged?

## 2018-06-30 ENCOUNTER — Encounter: Payer: BLUE CROSS/BLUE SHIELD | Admitting: Gastroenterology

## 2018-08-05 DIAGNOSIS — Z118 Encounter for screening for other infectious and parasitic diseases: Secondary | ICD-10-CM | POA: Diagnosis not present

## 2018-08-05 DIAGNOSIS — N915 Oligomenorrhea, unspecified: Secondary | ICD-10-CM | POA: Diagnosis not present

## 2018-08-05 DIAGNOSIS — N760 Acute vaginitis: Secondary | ICD-10-CM | POA: Diagnosis not present

## 2018-08-05 DIAGNOSIS — Z1151 Encounter for screening for human papillomavirus (HPV): Secondary | ICD-10-CM | POA: Diagnosis not present

## 2018-08-05 DIAGNOSIS — Z01419 Encounter for gynecological examination (general) (routine) without abnormal findings: Secondary | ICD-10-CM | POA: Diagnosis not present

## 2018-08-05 DIAGNOSIS — Z124 Encounter for screening for malignant neoplasm of cervix: Secondary | ICD-10-CM | POA: Diagnosis not present

## 2018-08-05 DIAGNOSIS — Z6827 Body mass index (BMI) 27.0-27.9, adult: Secondary | ICD-10-CM | POA: Diagnosis not present

## 2019-09-28 DIAGNOSIS — Z01419 Encounter for gynecological examination (general) (routine) without abnormal findings: Secondary | ICD-10-CM | POA: Diagnosis not present

## 2019-09-28 DIAGNOSIS — Z6828 Body mass index (BMI) 28.0-28.9, adult: Secondary | ICD-10-CM | POA: Diagnosis not present

## 2019-09-28 DIAGNOSIS — Z124 Encounter for screening for malignant neoplasm of cervix: Secondary | ICD-10-CM | POA: Diagnosis not present

## 2019-09-28 DIAGNOSIS — Z1151 Encounter for screening for human papillomavirus (HPV): Secondary | ICD-10-CM | POA: Diagnosis not present

## 2019-09-28 DIAGNOSIS — Z1231 Encounter for screening mammogram for malignant neoplasm of breast: Secondary | ICD-10-CM | POA: Diagnosis not present

## 2019-10-04 IMAGING — US IR EMBO TUMOR ORGAN ISCHEMIA INFARCT INC GUIDE ROADMAPPING
1 series · 1 of 1 positions shown · non-contrast
Comparison: none

ADDENDUM:
To the

PROCEDURE:
Bilateral common femoral artery access was performed with
ultrasound. Ultrasound images were obtained for documentation of
common femoral artery patency on both sides.
INDICATION: Uterine fibroids, abnormal menstrual bleeding

[Series 1: ir embo tumor organ ischemia infarct inc guide roa · 0.08mm/px · 1 of 1 slices shown]
[im 1/1]
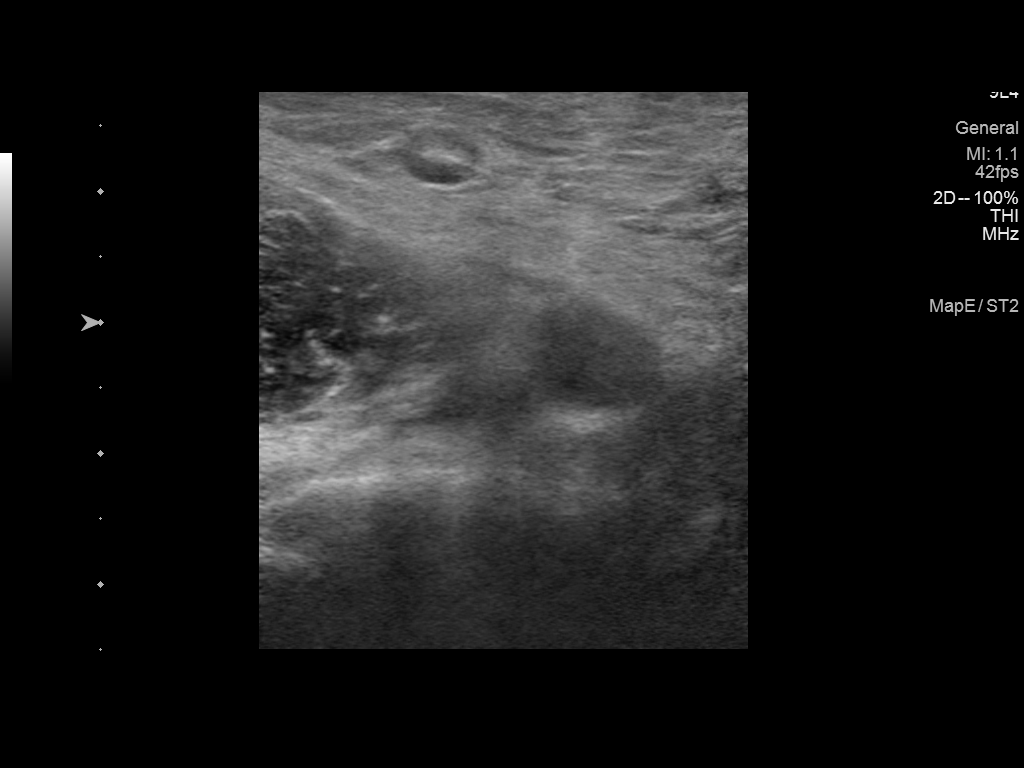

[1 of 1 positions shown; findings below may reference images not displayed]

EXAM:
UTERINE FIBROID EMBOLIZATION

Date:  08/07/2017 08/07/2017 [DATE]

Radiologist:  Bordeaux, Naja

Guidance:  Ultrasound and fluoroscopic

MEDICATIONS:
Ancef 2 g. The antibiotic was administered within 1 hour of the
procedure

ANESTHESIA/SEDATION:
Fentanyl 200 mcg IV; Versed 4 mg IV

Moderate Sedation Time:  50 minutes

The patient was continuously monitored during the procedure by the
interventional radiology nurse under my direct supervision.

CONTRAST:  110 cc [id number]

FLUOROSCOPY TIME:  Fluoroscopy Time: 14 minutes 12 seconds (923
mGy).

COMPLICATIONS:
None immediate.

PROCEDURE:
Informed consent was obtained from the patient following explanation
of the procedure, risks, benefits and alternatives. The patient
understands, agrees and consents for the procedure. All questions
were addressed. A time out was performed.

Maximal barrier sterile technique utilized including caps, mask,
sterile gowns, sterile gloves, large sterile drape, hand hygiene,
and betadine prep.

Under sterile conditions and local anesthesia, bilateralcommon
femoral artery access was performed with micropuncture needles.
Ultrasound was utilized for access. Images were obtained for
documentation. Guidewires were advanced followed by 5-French
sheaths. 5-French C2 catheters were utilized to select the
contralateral bilateral internal iliac arteries. Selective internal
iliac angiograms were performed. The tortuous uterine arteries were
identified. Selective catheterization was performed of both uterine
arteries with micro catheters and micro glide wires. Bilateral
uterine angiography was performed. This demonstrated patency of the
uterine arteries bilaterally with mild diffuse hypervascularity of
the enlarged fibroid uterus. Bilateral access was adequate for
embolization.

For left uterine artery embolization, [REDACTED] vials of 500 -
433micron Embospheres and [DATE] of a vialof 700-900 micron Embospheres
were injected into the left uterine artery. Post embolization
angiogram confirms complete stasis of the left uterine vascular
territory.

Right uterine artery embolization: Embolization was performed to
complete stasis in a similar fashion with injection of [REDACTED]
vials of 500-700 micron Embospheres and [DATE] of a vialof 700-900
micron Embospheres. Post embolization angiogram confirms complete
stasis of the right uterine vascular territory.

Micro catheters and C2 catheters were removed

Injection of the bilateralcommon femoral artery sheaths confirms
access is adequate for ExoSeal. Hemostasis was obtained with
bilateral ExoSeal devices. No immediate complication. Peripheral
pedal pulses are normal +2.

The patient tolerated the procedure well. No immediate complication.
IMPRESSION: Successful bilateral uterine artery embolization (U F E)

## 2019-10-12 DIAGNOSIS — Z1329 Encounter for screening for other suspected endocrine disorder: Secondary | ICD-10-CM | POA: Diagnosis not present

## 2019-10-12 DIAGNOSIS — Z13220 Encounter for screening for lipoid disorders: Secondary | ICD-10-CM | POA: Diagnosis not present

## 2019-10-12 DIAGNOSIS — Z130 Encounter for screening for diseases of the blood and blood-forming organs and certain disorders involving the immune mechanism: Secondary | ICD-10-CM | POA: Diagnosis not present

## 2019-10-12 DIAGNOSIS — Z131 Encounter for screening for diabetes mellitus: Secondary | ICD-10-CM | POA: Diagnosis not present

## 2019-10-12 DIAGNOSIS — Z0000 Encounter for general adult medical examination without abnormal findings: Secondary | ICD-10-CM | POA: Diagnosis not present

## 2019-10-14 ENCOUNTER — Encounter: Payer: Self-pay | Admitting: Gastroenterology

## 2019-10-22 ENCOUNTER — Encounter: Payer: Self-pay | Admitting: General Practice

## 2019-11-16 DIAGNOSIS — Z3202 Encounter for pregnancy test, result negative: Secondary | ICD-10-CM | POA: Diagnosis not present

## 2019-11-16 DIAGNOSIS — R87810 Cervical high risk human papillomavirus (HPV) DNA test positive: Secondary | ICD-10-CM | POA: Diagnosis not present

## 2019-11-16 DIAGNOSIS — B977 Papillomavirus as the cause of diseases classified elsewhere: Secondary | ICD-10-CM | POA: Diagnosis not present

## 2019-11-17 ENCOUNTER — Emergency Department (HOSPITAL_COMMUNITY)
Admission: EM | Admit: 2019-11-17 | Discharge: 2019-11-17 | Disposition: A | Discharge status: Home / Self Care | Payer: BC Managed Care – PPO | Attending: Emergency Medicine | Admitting: Emergency Medicine

## 2019-11-17 ENCOUNTER — Other Ambulatory Visit: Payer: Self-pay

## 2019-11-17 ENCOUNTER — Ambulatory Visit
Admission: EM | Admit: 2019-11-17 | Discharge: 2019-11-17 | Disposition: A | Discharge status: Home / Self Care | Payer: BC Managed Care – PPO | Attending: Physician Assistant | Admitting: Physician Assistant

## 2019-11-17 ENCOUNTER — Encounter (HOSPITAL_COMMUNITY): Payer: Self-pay | Admitting: *Deleted

## 2019-11-17 ENCOUNTER — Inpatient Hospital Stay: Admission: RE | Admit: 2019-11-17 | Payer: Self-pay | Source: Ambulatory Visit

## 2019-11-17 DIAGNOSIS — R42 Dizziness and giddiness: Secondary | ICD-10-CM | POA: Diagnosis not present

## 2019-11-17 DIAGNOSIS — M549 Dorsalgia, unspecified: Secondary | ICD-10-CM | POA: Diagnosis not present

## 2019-11-17 DIAGNOSIS — H538 Other visual disturbances: Secondary | ICD-10-CM | POA: Diagnosis not present

## 2019-11-17 DIAGNOSIS — M545 Low back pain: Secondary | ICD-10-CM | POA: Insufficient documentation

## 2019-11-17 DIAGNOSIS — R52 Pain, unspecified: Secondary | ICD-10-CM

## 2019-11-17 DIAGNOSIS — Z5321 Procedure and treatment not carried out due to patient leaving prior to being seen by health care provider: Secondary | ICD-10-CM | POA: Diagnosis not present
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
## 2019-11-17 NOTE — Discharge Instructions (Signed)
No alarming signs on your exam. Your neurology exam was normal for now, start ibuprofen 600-800mg  three times a day. Monitor to see if dizziness/blurry vision resolves as symptoms improving. If symptoms not improving, but not worsening, follow up with PCP/ophthalmology for further evaluation. I  Neck If experiencing loss of grip strength, numbness to the arm, go to the emergency department for further evaluation.   Head If experiencing worsening of symptoms, headache/blurry vision, nausea/vomiting, confusion/altered mental status, dizziness, weakness, passing out, imbalance, go to the emergency department for further evaluation.

## 2019-11-17 NOTE — ED Notes (Signed)
PT walked up to front ED staff and gave staff her barcodes, PT stated she is leaving and walked out of ED.
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
## 2019-11-20 ENCOUNTER — Emergency Department (HOSPITAL_COMMUNITY)
Admission: EM | Admit: 2019-11-20 | Discharge: 2019-11-20 | Disposition: A | Discharge status: Home / Self Care | Payer: BC Managed Care – PPO | Attending: Emergency Medicine | Admitting: Emergency Medicine

## 2019-11-20 ENCOUNTER — Encounter (HOSPITAL_COMMUNITY): Payer: Self-pay | Admitting: Emergency Medicine

## 2019-11-20 ENCOUNTER — Other Ambulatory Visit: Payer: Self-pay

## 2019-11-20 ENCOUNTER — Emergency Department (HOSPITAL_COMMUNITY): Payer: BC Managed Care – PPO

## 2019-11-20 DIAGNOSIS — R42 Dizziness and giddiness: Secondary | ICD-10-CM | POA: Diagnosis not present

## 2019-11-20 DIAGNOSIS — R519 Headache, unspecified: Secondary | ICD-10-CM

## 2019-11-20 DIAGNOSIS — R030 Elevated blood-pressure reading, without diagnosis of hypertension: Secondary | ICD-10-CM | POA: Diagnosis not present

## 2019-11-20 DIAGNOSIS — R110 Nausea: Secondary | ICD-10-CM | POA: Diagnosis not present

## 2019-11-20 MED ORDER — ACETAMINOPHEN 500 MG PO TABS
1000.0000 mg | ORAL_TABLET | Freq: Once | ORAL | Status: AC
  Administered 2019-11-20: 1000 mg via ORAL
  Filled 2019-11-20: qty 2

## 2019-11-20 MED ORDER — SODIUM CHLORIDE 0.9% FLUSH: 3.0000 mL | Freq: Once | INTRAVENOUS | Status: DC

## 2019-11-20 NOTE — Discharge Instructions (Signed)
It was our pleasure to provide your ER care today - we hope that you feel better.  Rest. Drink plenty of fluids.   Take acetaminophen or ibuprofen as need.   Follow up with primary care doctor in the next 1-2 weeks if symptoms fail to improve/resolve.  Also follow up with primary care doctor in the next couple weeks for recheck of blood pressure, as it is high today.  Return to ER if worse, new symptoms, severe pain, fainting, numbness/weakness, or other concern.
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
## 2019-11-20 NOTE — ED Triage Notes (Signed)
Patient is complaining of nausea, headache and dizziness.

## 2019-11-24 DIAGNOSIS — M542 Cervicalgia: Secondary | ICD-10-CM | POA: Diagnosis not present

## 2019-11-24 DIAGNOSIS — S46812A Strain of other muscles, fascia and tendons at shoulder and upper arm level, left arm, initial encounter: Secondary | ICD-10-CM | POA: Diagnosis not present

## 2019-11-24 DIAGNOSIS — S161XXA Strain of muscle, fascia and tendon at neck level, initial encounter: Secondary | ICD-10-CM | POA: Diagnosis not present

## 2019-11-24 DIAGNOSIS — M546 Pain in thoracic spine: Secondary | ICD-10-CM | POA: Diagnosis not present

## 2019-11-25 DIAGNOSIS — R42 Dizziness and giddiness: Secondary | ICD-10-CM | POA: Diagnosis not present

## 2019-12-01 DIAGNOSIS — R202 Paresthesia of skin: Secondary | ICD-10-CM | POA: Diagnosis not present

## 2019-12-01 DIAGNOSIS — R42 Dizziness and giddiness: Secondary | ICD-10-CM | POA: Diagnosis not present

## 2019-12-02 DIAGNOSIS — R202 Paresthesia of skin: Secondary | ICD-10-CM | POA: Diagnosis not present

## 2019-12-07 ENCOUNTER — Encounter: Payer: Self-pay | Admitting: Neurology

## 2019-12-13 ENCOUNTER — Other Ambulatory Visit: Payer: Self-pay

## 2019-12-13 ENCOUNTER — Encounter: Payer: Self-pay | Admitting: Gastroenterology

## 2019-12-13 ENCOUNTER — Ambulatory Visit: Payer: BC Managed Care – PPO | Admitting: *Deleted

## 2019-12-13 VITALS — Ht 66.75 in | Wt 182.0 lb

## 2019-12-13 DIAGNOSIS — Z01818 Encounter for other preprocedural examination: Secondary | ICD-10-CM

## 2019-12-13 DIAGNOSIS — Z800 Family history of malignant neoplasm of digestive organs: Secondary | ICD-10-CM

## 2019-12-13 MED ORDER — SUTAB 1479-225-188 MG PO TABS
1.0000 | ORAL_TABLET | Freq: Once | ORAL | 0 refills | Status: AC
Start: 2019-12-13 — End: 2019-12-13

## 2019-12-13 NOTE — Progress Notes (Signed)
covid test 12-22-19 at 10:30 am  Pt is aware that care partner will wait in the car during procedure; if they feel like they will be too hot or cold to wait in the car; they may wait in the 4 th floor lobby. Patient is aware to bring only one care partner. We want them to wear a mask (we do not have any that we can provide them), practice social distancing, and we will check their temperatures when they get here.  I did remind the patient that their care partner needs to stay in the parking lot the entire time and have a cell phone available, we will call them when the pt is ready for discharge. Patient will wear mask into building.   No trouble with anesthesia, difficulty with intubation or hx/fam hx of malignant hyperthermia per pt   No egg or soy allergy  No home oxygen use   No medications for weight loss taken  emmi information given- sent via MyChart  Pt denies constipation issues   Sutab code put into RX and paper copy given to pt to show pharmacy

## 2019-12-22 ENCOUNTER — Other Ambulatory Visit: Payer: Self-pay | Admitting: Gastroenterology

## 2019-12-22 DIAGNOSIS — Z1159 Encounter for screening for other viral diseases: Secondary | ICD-10-CM | POA: Diagnosis not present

## 2019-12-22 LAB — SARS CORONAVIRUS 2 (TAT 6-24 HRS): SARS Coronavirus 2: NEGATIVE

## 2019-12-27 ENCOUNTER — Encounter: Payer: Self-pay | Admitting: Gastroenterology

## 2019-12-27 ENCOUNTER — Ambulatory Visit (AMBULATORY_SURGERY_CENTER): Payer: BC Managed Care – PPO | Admitting: Gastroenterology

## 2019-12-27 ENCOUNTER — Other Ambulatory Visit: Payer: Self-pay

## 2019-12-27 VITALS — BP 124/75 | HR 54 | Temp 98.2°F | Resp 14 | Ht 66.0 in | Wt 182.0 lb

## 2019-12-27 DIAGNOSIS — Z800 Family history of malignant neoplasm of digestive organs: Secondary | ICD-10-CM

## 2019-12-27 DIAGNOSIS — Z1211 Encounter for screening for malignant neoplasm of colon: Secondary | ICD-10-CM

## 2019-12-27 DIAGNOSIS — D123 Benign neoplasm of transverse colon: Secondary | ICD-10-CM | POA: Diagnosis not present

## 2019-12-27 DIAGNOSIS — D125 Benign neoplasm of sigmoid colon: Secondary | ICD-10-CM

## 2019-12-27 MED ORDER — SODIUM CHLORIDE 0.9 % IV SOLN: 500.0000 mL | Freq: Once | INTRAVENOUS | Status: DC

## 2019-12-27 NOTE — Progress Notes (Signed)
PT taken to PACU. Monitors in place. VSS. Report given to RN. 

## 2019-12-27 NOTE — Op Note (Signed)
[facility] Patient Name: Karla Wolf Procedure Date: 12/27/2019 10:34 AM MRN: [id number] Endoscopist: Thornton Park MD, MD Age: 44 Referring MD:  Date of Birth: [birth year] Gender: Female Account #: [id number] Procedure:                Colonoscopy Indications:              Screening in patient at increased risk: Family                            history of 1st-degree relative with colorectal                            cancer before age 70 years Medicines:                Monitored Anesthesia Care Procedure:                Pre-Anesthesia Assessment:                           - Prior to the procedure, a History and Physical                            was performed, and patient medications and                            allergies were reviewed. The patient's tolerance of                            previous anesthesia was also reviewed. The risks                            and benefits of the procedure and the sedation                            options and risks were discussed with the patient.                            All questions were answered, and informed consent                            was obtained. Prior Anticoagulants: The patient has                            taken no previous anticoagulant or antiplatelet                            agents. ASA Grade Assessment: II - A patient with                            mild systemic disease. After reviewing the risks                            and benefits, the patient was deemed in  satisfactory condition to undergo the procedure.                           After obtaining informed consent, the colonoscope                            was passed under direct vision. Throughout the                            procedure, the patient's blood pressure, pulse, and                            oxygen saturations were monitored continuously. The                            Colonoscope was introduced  through the anus and                            advanced to the 2 cm into the ileum. The                            colonoscopy was performed without difficulty. The                            patient tolerated the procedure well. The quality                            of the bowel preparation was excellent. The                            terminal ileum, ileocecal valve, appendiceal                            orifice, and rectum were photographed. Scope In: 10:51:52 AM Scope Out: 11:05:05 AM Scope Withdrawal Time: 0 hours 10 minutes 25 seconds  Total Procedure Duration: 0 hours 13 minutes 13 seconds  Findings:                 The perianal and digital rectal examinations were                            normal.                           A 1 mm polyp was found in the sigmoid colon. The                            polyp was flat. The snare slid over the polyp                            multiple times without traction. The polyp was                            ultimately removed with a cold biopsy forceps.  Resection and retrieval were complete. Estimated                            blood loss was minimal.                           A 2-3 mm polyp was found in the splenic flexure.                            The polyp was flat. The polyp was removed with a                            cold snare. Resection and retrieval were complete.                            Estimated blood loss was minimal.                           The exam was otherwise without abnormality on                            direct and retroflexion views. Complications:            No immediate complications. Estimated blood loss:                            Minimal. Estimated Blood Loss:     Estimated blood loss was minimal. Impression:               - One 1 mm polyp in the sigmoid colon, removed with                            a cold biopsy forceps. Resected and retrieved.                           - One  2-3 mm polyp at the splenic flexure, removed                            with a cold snare. Resected and retrieved.                           - The examination was otherwise normal on direct                            and retroflexion views. Recommendation:           - Patient has a contact number available for                            emergencies. The signs and symptoms of potential                            delayed complications were discussed with the  patient. Return to normal activities tomorrow.                            Written discharge instructions were provided to the                            patient.                           - Resume previous diet.                           - Continue present medications.                           - Await pathology results.                           - Repeat colonoscopy in 5 years for surveillance.                           - Emerging evidence supports eating a diet of                            fruits, vegetables, grains, calcium, and yogurt                            while reducing red meat and alcohol may reduce the                            risk of colon cancer.                           - Thank you for allowing me to be involved in your                            colon cancer prevention. Thornton Park MD, MD 12/27/2019 11:10:17 AM This report has been signed electronically.

## 2019-12-27 NOTE — Progress Notes (Signed)
Called to room to assist during endoscopic procedure.  Patient ID and intended procedure confirmed with present staff. Received instructions for my participation in the procedure from the performing physician.  

## 2019-12-27 NOTE — Progress Notes (Signed)
Pt's states no medical or surgical changes since previsit or office visit.  [facility]ney- vitals 

## 2019-12-27 NOTE — Patient Instructions (Signed)
Handouts given:  Polyps Resume previous diet Continue present medications Await pathology results Repeat colonoscopy in 5 years for surveillance   YOU HAD AN ENDOSCOPIC PROCEDURE TODAY AT [facility]:   Refer to the procedure report that was given to you for any specific questions about what was found during the examination.  If the procedure report does not answer your questions, please call your gastroenterologist to clarify.  If you requested that your care partner not be given the details of your procedure findings, then the procedure report has been included in a sealed envelope for you to review at your convenience later.  YOU SHOULD EXPECT: Some feelings of bloating in the abdomen. Passage of more gas than usual.  Walking can help get rid of the air that was put into your GI tract during the procedure and reduce the bloating. If you had a lower endoscopy (such as a colonoscopy or flexible sigmoidoscopy) you may notice spotting of blood in your stool or on the toilet paper. If you underwent a bowel prep for your procedure, you may not have a normal bowel movement for a few days.  Please Note:  You might notice some irritation and congestion in your nose or some drainage.  This is from the oxygen used during your procedure.  There is no need for concern and it should clear up in a day or so.  SYMPTOMS TO REPORT IMMEDIATELY:   Following lower endoscopy (colonoscopy or flexible sigmoidoscopy):  Excessive amounts of blood in the stool  Significant tenderness or worsening of abdominal pains  Swelling of the abdomen that is new, acute  Fever of 100F or higher    For urgent or emergent issues, a gastroenterologist can be reached at any hour by calling [phone]. Do not use MyChart messaging for urgent concerns.    DIET:  We do recommend a small meal at first, but then you may proceed to your regular diet.  Drink plenty of fluids but you should avoid alcoholic  beverages for 24 hours.  ACTIVITY:  You should plan to take it easy for the rest of today and you should NOT DRIVE or use heavy machinery until tomorrow (because of the sedation medicines used during the test).    FOLLOW UP: Our staff will call the number listed on your records 48-72 hours following your procedure to check on you and address any questions or concerns that you may have regarding the information given to you following your procedure. If we do not reach you, we will leave a message.  We will attempt to reach you two times.  During this call, we will ask if you have developed any symptoms of COVID 19. If you develop any symptoms (ie: fever, flu-like symptoms, shortness of breath, cough etc.) before then, please call [phone].  If you test positive for Covid 19 in the 2 weeks post procedure, please call and report this information to [place].    If any biopsies were taken you will be contacted by phone or by letter within the next 1-3 weeks.  Please call us at [phone] if you have not heard about the biopsies in 3 weeks.    SIGNATURES/CONFIDENTIALITY: You and/or your care partner have signed paperwork which will be entered into your electronic medical record.  These signatures attest to the fact that that the information above on your After Visit Summary has been reviewed and is understood.  Full responsibility of the confidentiality of this discharge information lies  with you and/or your care-partner.

## 2019-12-29 ENCOUNTER — Telehealth: Payer: Self-pay

## 2019-12-29 NOTE — Telephone Encounter (Signed)
  Follow up Call-  Call back number 12/27/2019  Post procedure Call Back phone  # [phone]  Permission to leave phone message Yes  Some recent data might be hidden     Patient questions:  Do you have a fever, pain , or abdominal swelling? No. Pain Score  0 *  Have you tolerated food without any problems? Yes.    Have you been able to return to your normal activities? Yes.    Do you have any questions about your discharge instructions: Diet   No. Medications  No. Follow up visit  No.  Do you have questions or concerns about your Care? No.  Actions: * If pain score is 4 or above: No action needed, pain <4. 1. Have you developed a fever since your procedure? no  2.   Have you had an respiratory symptoms (SOB or cough) since your procedure? no  3.   Have you tested positive for COVID 19 since your procedure no  4.   Have you had any family members/close contacts diagnosed with the COVID 19 since your procedure?  no   If yes to any of these questions please route to Joylene John, RN and Joella Prince, RN

## 2019-12-30 ENCOUNTER — Encounter: Payer: Self-pay | Admitting: Gastroenterology

## 2020-01-24 DIAGNOSIS — Z113 Encounter for screening for infections with a predominantly sexual mode of transmission: Secondary | ICD-10-CM | POA: Diagnosis not present

## 2020-01-24 DIAGNOSIS — Z1159 Encounter for screening for other viral diseases: Secondary | ICD-10-CM | POA: Diagnosis not present

## 2020-01-24 DIAGNOSIS — Z114 Encounter for screening for human immunodeficiency virus [HIV]: Secondary | ICD-10-CM | POA: Diagnosis not present

## 2020-01-24 DIAGNOSIS — Z118 Encounter for screening for other infectious and parasitic diseases: Secondary | ICD-10-CM | POA: Diagnosis not present

## 2020-02-28 ENCOUNTER — Other Ambulatory Visit: Payer: Self-pay

## 2020-02-28 ENCOUNTER — Encounter: Payer: Self-pay | Admitting: Neurology

## 2020-02-28 ENCOUNTER — Ambulatory Visit (INDEPENDENT_AMBULATORY_CARE_PROVIDER_SITE_OTHER): Payer: BC Managed Care – PPO | Admitting: Neurology

## 2020-02-28 VITALS — BP 125/85 | HR 79 | Ht 66.0 in | Wt 179.0 lb

## 2020-02-28 DIAGNOSIS — R202 Paresthesia of skin: Secondary | ICD-10-CM | POA: Diagnosis not present

## 2020-02-28 NOTE — Patient Instructions (Signed)
If your symptoms get worse, please contact my office to schedule nerve testing
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
## 2020-10-09 DIAGNOSIS — M545 Low back pain, unspecified: Secondary | ICD-10-CM | POA: Diagnosis not present

## 2020-10-12 DIAGNOSIS — R87810 Cervical high risk human papillomavirus (HPV) DNA test positive: Secondary | ICD-10-CM | POA: Diagnosis not present

## 2020-10-12 DIAGNOSIS — Z0000 Encounter for general adult medical examination without abnormal findings: Secondary | ICD-10-CM | POA: Diagnosis not present

## 2020-10-12 DIAGNOSIS — Z1329 Encounter for screening for other suspected endocrine disorder: Secondary | ICD-10-CM | POA: Diagnosis not present

## 2020-10-12 DIAGNOSIS — Z124 Encounter for screening for malignant neoplasm of cervix: Secondary | ICD-10-CM | POA: Diagnosis not present

## 2020-10-12 DIAGNOSIS — Z01419 Encounter for gynecological examination (general) (routine) without abnormal findings: Secondary | ICD-10-CM | POA: Diagnosis not present

## 2020-10-12 DIAGNOSIS — S6992XA Unspecified injury of left wrist, hand and finger(s), initial encounter: Secondary | ICD-10-CM | POA: Diagnosis not present

## 2020-10-12 DIAGNOSIS — Z1231 Encounter for screening mammogram for malignant neoplasm of breast: Secondary | ICD-10-CM | POA: Diagnosis not present

## 2020-10-12 DIAGNOSIS — Z131 Encounter for screening for diabetes mellitus: Secondary | ICD-10-CM | POA: Diagnosis not present

## 2020-10-12 DIAGNOSIS — N926 Irregular menstruation, unspecified: Secondary | ICD-10-CM | POA: Diagnosis not present

## 2020-10-12 DIAGNOSIS — Z113 Encounter for screening for infections with a predominantly sexual mode of transmission: Secondary | ICD-10-CM | POA: Diagnosis not present

## 2020-10-12 DIAGNOSIS — W231XXA Caught, crushed, jammed, or pinched between stationary objects, initial encounter: Secondary | ICD-10-CM | POA: Diagnosis not present

## 2020-10-12 DIAGNOSIS — Z6829 Body mass index (BMI) 29.0-29.9, adult: Secondary | ICD-10-CM | POA: Diagnosis not present

## 2020-10-12 DIAGNOSIS — Z13220 Encounter for screening for lipoid disorders: Secondary | ICD-10-CM | POA: Diagnosis not present

## 2020-11-07 DIAGNOSIS — M25562 Pain in left knee: Secondary | ICD-10-CM | POA: Diagnosis not present

## 2020-11-09 DIAGNOSIS — M25562 Pain in left knee: Secondary | ICD-10-CM | POA: Diagnosis not present

## 2020-11-14 DIAGNOSIS — S83242A Other tear of medial meniscus, current injury, left knee, initial encounter: Secondary | ICD-10-CM | POA: Diagnosis not present

## 2020-11-14 DIAGNOSIS — M25562 Pain in left knee: Secondary | ICD-10-CM | POA: Diagnosis not present

## 2020-12-14 DIAGNOSIS — S83242A Other tear of medial meniscus, current injury, left knee, initial encounter: Secondary | ICD-10-CM | POA: Diagnosis not present

## 2020-12-14 DIAGNOSIS — M25562 Pain in left knee: Secondary | ICD-10-CM | POA: Diagnosis not present

## 2021-01-10 DIAGNOSIS — M94262 Chondromalacia, left knee: Secondary | ICD-10-CM | POA: Diagnosis not present

## 2021-01-10 DIAGNOSIS — M23222 Derangement of posterior horn of medial meniscus due to old tear or injury, left knee: Secondary | ICD-10-CM | POA: Diagnosis not present

## 2021-01-10 DIAGNOSIS — M6752 Plica syndrome, left knee: Secondary | ICD-10-CM | POA: Diagnosis not present

## 2021-01-10 DIAGNOSIS — G8918 Other acute postprocedural pain: Secondary | ICD-10-CM | POA: Diagnosis not present

## 2021-01-17 DIAGNOSIS — S83201D Bucket-handle tear of unspecified meniscus, current injury, left knee, subsequent encounter: Secondary | ICD-10-CM | POA: Diagnosis not present

## 2021-01-17 DIAGNOSIS — M2242 Chondromalacia patellae, left knee: Secondary | ICD-10-CM | POA: Diagnosis not present

## 2021-01-19 DIAGNOSIS — S83201D Bucket-handle tear of unspecified meniscus, current injury, left knee, subsequent encounter: Secondary | ICD-10-CM | POA: Diagnosis not present

## 2021-01-19 DIAGNOSIS — M2242 Chondromalacia patellae, left knee: Secondary | ICD-10-CM | POA: Diagnosis not present

## 2021-11-16 DIAGNOSIS — R87810 Cervical high risk human papillomavirus (HPV) DNA test positive: Secondary | ICD-10-CM | POA: Diagnosis not present

## 2021-11-16 DIAGNOSIS — Z1231 Encounter for screening mammogram for malignant neoplasm of breast: Secondary | ICD-10-CM | POA: Diagnosis not present

## 2021-11-16 DIAGNOSIS — Z6830 Body mass index (BMI) 30.0-30.9, adult: Secondary | ICD-10-CM | POA: Diagnosis not present

## 2021-11-16 DIAGNOSIS — Z01419 Encounter for gynecological examination (general) (routine) without abnormal findings: Secondary | ICD-10-CM | POA: Diagnosis not present

## 2022-01-16 IMAGING — CT CT HEAD W/O CM
3 of 4 series · 15 of 47 positions shown, 18 images · non-contrast
Comparison: None.

CLINICAL DATA: Headache, nausea and dizziness.

EXAM:
CT HEAD WITHOUT CONTRAST
TECHNIQUE: Contiguous axial images were obtained from the base of the skull
through the vertex without intravenous contrast.

[Series 2: head wo · axial · 0.42mm/px · z∈[-126,-6]mm · 9 of 32 slices shown, 12 images]
[im 4/32  brain]
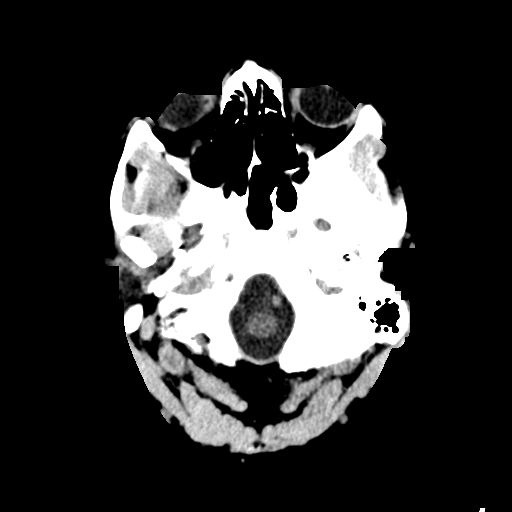
[im 4/32  bone]
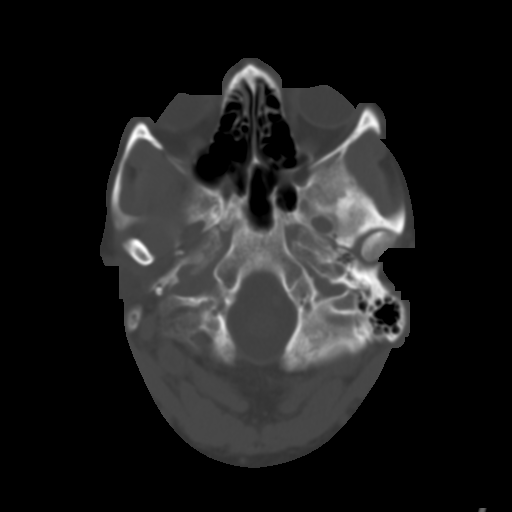
[im 7/32  brain]
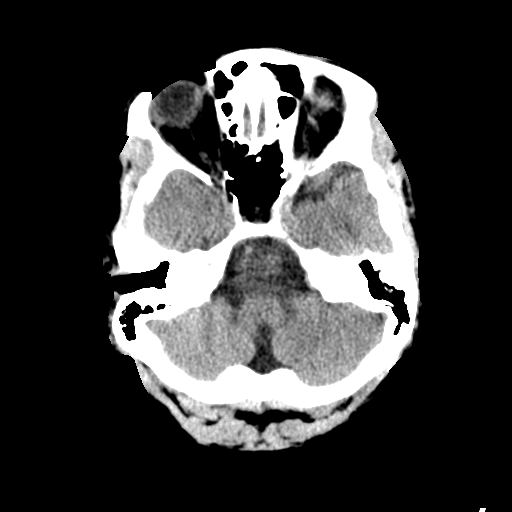
[im 10/32  brain]
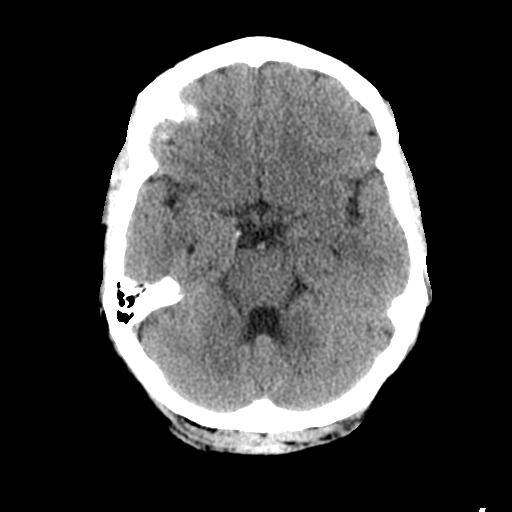
[im 13/32  brain]
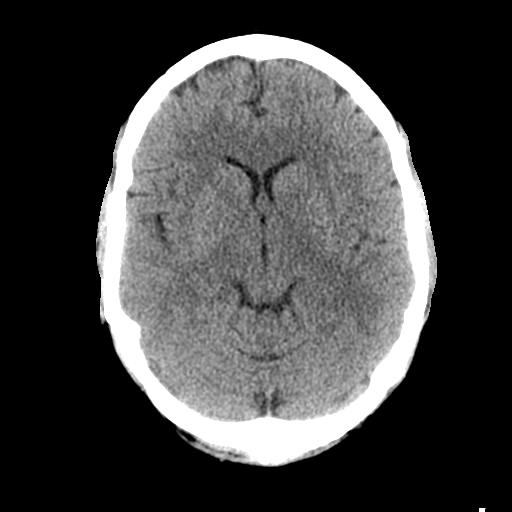
[im 16/32  brain]
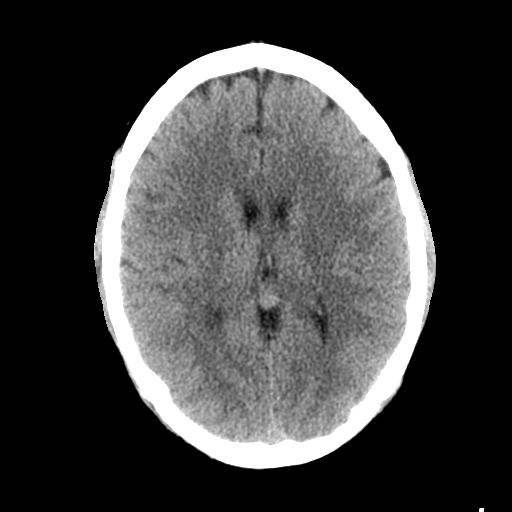
[im 16/32  bone]
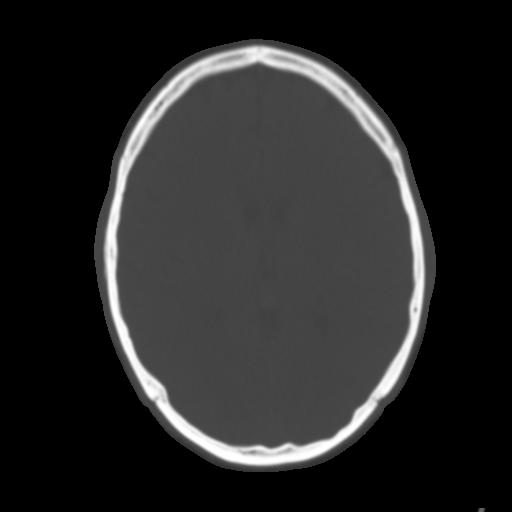
[im 19/32  brain]
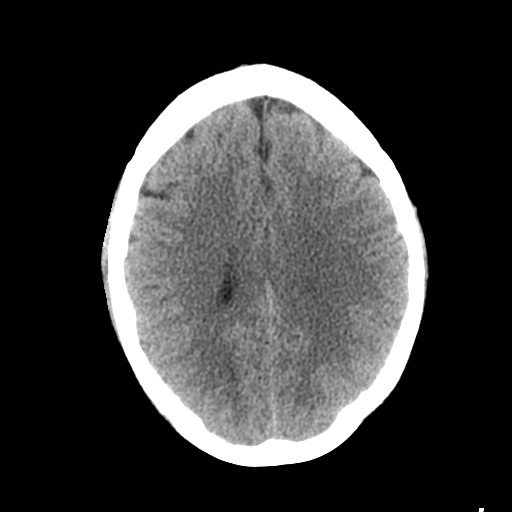
[im 22/32  brain]
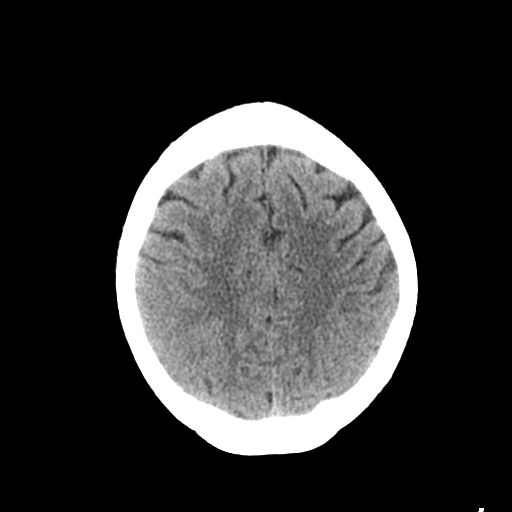
[im 25/32  brain]
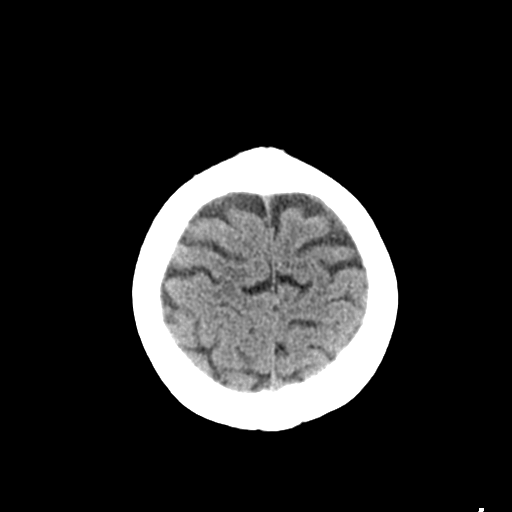
[im 28/32  brain]
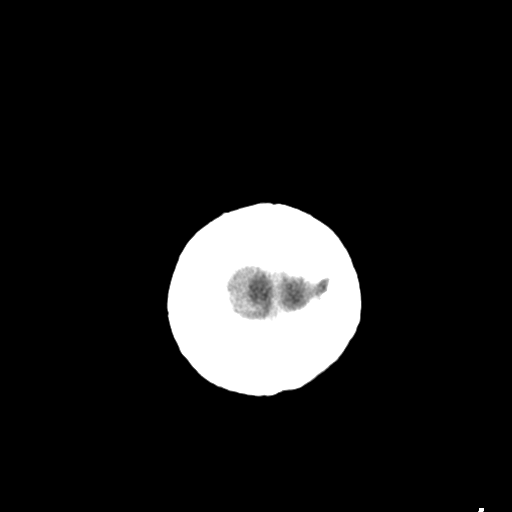
[im 28/32  bone]
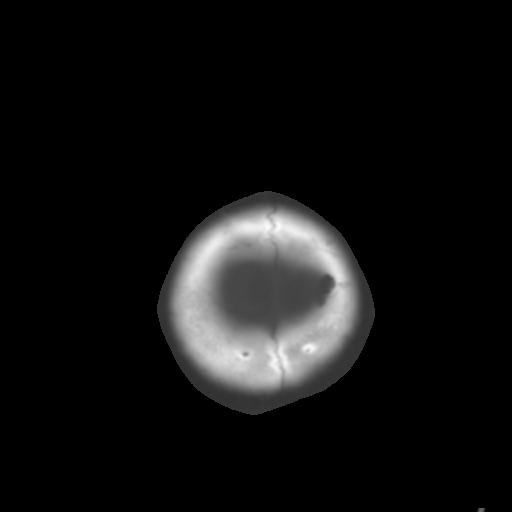

[Series 5: coronal soft tissue · coronal · 0.33mm/px · 3 of 70 slices shown]
[im 24/70  brain]
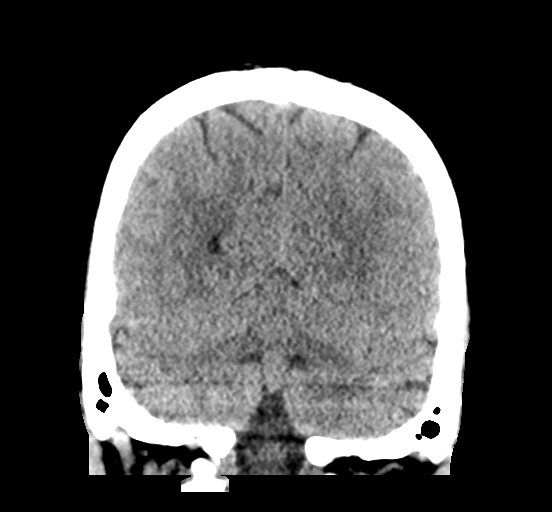
[im 31/70  brain]
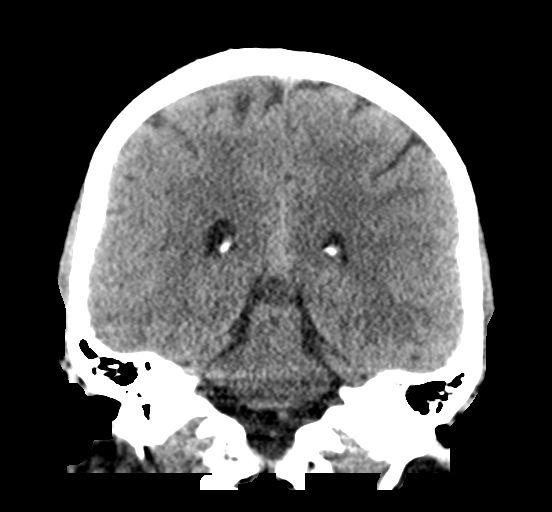
[im 39/70  brain]
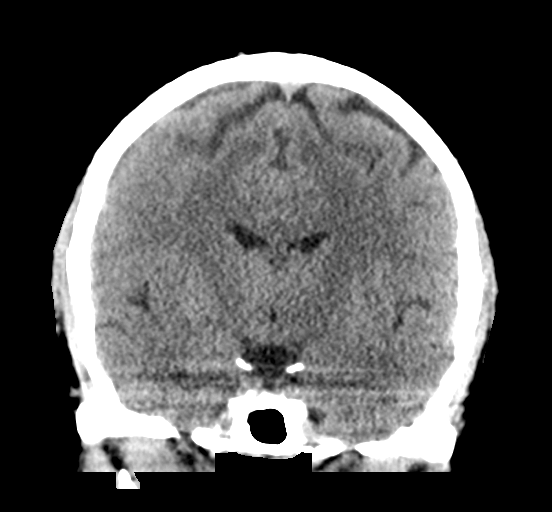

[Series 6: sagittal soft tissue · sagittal · 0.32mm/px · 3 of 55 slices shown]
[im 19/55  brain]
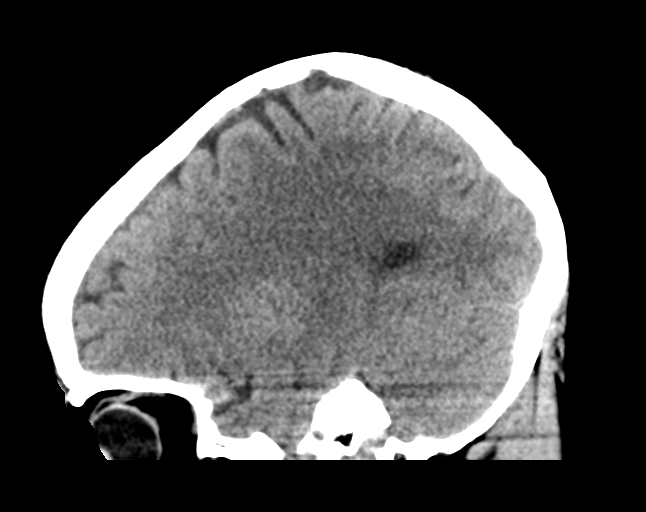
[im 28/55  brain]
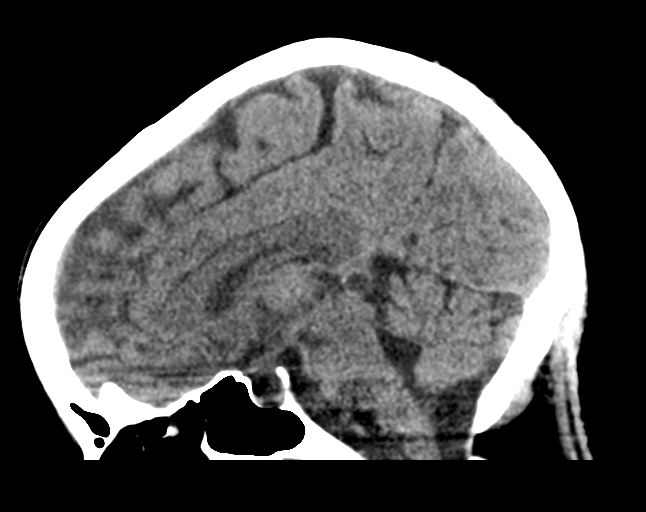
[im 37/55  brain]
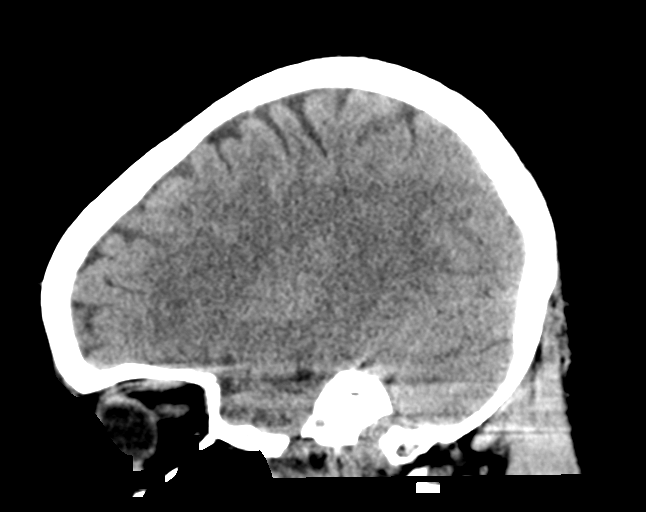

[15 of 47 positions shown; findings below may reference images not displayed]

FINDINGS: Brain: No evidence of acute infarction, hemorrhage, hydrocephalus,
extra-axial collection or mass lesion/mass effect.

Vascular: No hyperdense vessel or unexpected calcification.

Skull: Normal. Negative for fracture or focal lesion.

Sinuses/Orbits: No acute finding.

Other: None.
IMPRESSION: No acute intracranial pathology.

## 2022-06-07 ENCOUNTER — Emergency Department (HOSPITAL_COMMUNITY)
Admission: EM | Admit: 2022-06-07 | Discharge: 2022-06-07 | Disposition: A | Discharge status: Home / Self Care | Payer: No Typology Code available for payment source | Attending: Emergency Medicine | Admitting: Emergency Medicine

## 2022-06-07 ENCOUNTER — Encounter (HOSPITAL_COMMUNITY): Payer: Self-pay

## 2022-06-07 ENCOUNTER — Other Ambulatory Visit: Payer: Self-pay

## 2022-06-07 DIAGNOSIS — H9212 Otorrhea, left ear: Secondary | ICD-10-CM | POA: Insufficient documentation

## 2022-06-07 DIAGNOSIS — R42 Dizziness and giddiness: Secondary | ICD-10-CM | POA: Insufficient documentation

## 2022-06-07 DIAGNOSIS — R1032 Left lower quadrant pain: Secondary | ICD-10-CM | POA: Diagnosis not present

## 2022-06-07 DIAGNOSIS — R110 Nausea: Secondary | ICD-10-CM | POA: Insufficient documentation

## 2022-06-07 DIAGNOSIS — H9202 Otalgia, left ear: Secondary | ICD-10-CM | POA: Diagnosis not present

## 2022-06-07 LAB — CBC WITH DIFFERENTIAL/PLATELET
Abs Immature Granulocytes: 0.02 10*3/uL (ref 0.00–0.07)
Basophils Absolute: 0 10*3/uL (ref 0.0–0.1)
Basophils Relative: 1 %
Eosinophils Absolute: 0.1 10*3/uL (ref 0.0–0.5)
Eosinophils Relative: 1 %
HCT: 40.8 % (ref 36.0–46.0)
Hemoglobin: 12.9 g/dL (ref 12.0–15.0)
Immature Granulocytes: 0 %
Lymphocytes Relative: 41 %
Lymphs Abs: 2.6 10*3/uL (ref 0.7–4.0)
MCH: 25.8 pg — ABNORMAL LOW (ref 26.0–34.0)
MCHC: 31.6 g/dL (ref 30.0–36.0)
MCV: 81.6 fL (ref 80.0–100.0)
Monocytes Absolute: 0.4 10*3/uL (ref 0.1–1.0)
Monocytes Relative: 6 %
Neutro Abs: 3.3 10*3/uL (ref 1.7–7.7)
Neutrophils Relative %: 51 %
Platelets: 387 10*3/uL (ref 150–400)
RBC: 5 MIL/uL (ref 3.87–5.11)
RDW: 14.7 % (ref 11.5–15.5)
WBC: 6.4 10*3/uL (ref 4.0–10.5)
nRBC: 0 % (ref 0.0–0.2)

## 2022-06-07 LAB — URINALYSIS, ROUTINE W REFLEX MICROSCOPIC
Bilirubin Urine: NEGATIVE
Glucose, UA: NEGATIVE mg/dL
Ketones, ur: NEGATIVE mg/dL
Leukocytes,Ua: NEGATIVE
Nitrite: POSITIVE — AB
Protein, ur: NEGATIVE mg/dL
Specific Gravity, Urine: 1.018 (ref 1.005–1.030)
pH: 5 (ref 5.0–8.0)

## 2022-06-07 LAB — COMPREHENSIVE METABOLIC PANEL
ALT: 15 U/L (ref 0–44)
AST: 15 U/L (ref 15–41)
Albumin: 4.2 g/dL (ref 3.5–5.0)
Alkaline Phosphatase: 76 U/L (ref 38–126)
Anion gap: 7 (ref 5–15)
BUN: 19 mg/dL (ref 6–20)
CO2: 23 mmol/L (ref 22–32)
Calcium: 9.1 mg/dL (ref 8.9–10.3)
Chloride: 107 mmol/L (ref 98–111)
Creatinine, Ser: 0.98 mg/dL (ref 0.44–1.00)
GFR, Estimated: >60 mL/min (ref >60)
Glucose, Bld: 101 mg/dL — ABNORMAL HIGH (ref 70–99)
Potassium: 4.3 mmol/L (ref 3.5–5.1)
Sodium: 137 mmol/L (ref 135–145)
Total Bilirubin: 0.5 mg/dL (ref 0.3–1.2)
Total Protein: 7.7 g/dL (ref 6.5–8.1)

## 2022-06-07 LAB — LIPASE, BLOOD: Lipase: 33 U/L (ref 11–51)

## 2022-06-07 LAB — I-STAT BETA HCG BLOOD, ED (MC, WL, AP ONLY): I-stat hCG, quantitative: <5 m[IU]/mL (ref <5)

## 2022-06-07 MED ORDER — ONDANSETRON 8 MG PO TBDP: 8.0000 mg | ORAL_TABLET | Freq: Three times a day (TID) | ORAL | 0 refills | Status: AC | PRN

## 2022-06-07 MED ORDER — MECLIZINE HCL 25 MG PO TABS: 25.0000 mg | ORAL_TABLET | Freq: Three times a day (TID) | ORAL | 0 refills | Status: AC | PRN

## 2022-06-07 NOTE — ED Provider Notes (Signed)
[facility] EMERGENCY DEPARTMENT AT [facility] Provider Note   CSN: [id number] Arrival date & time: 06/07/22  1223     History {Add pertinent medical, surgical, social history, OB history to HPI:1} Chief Complaint  Patient presents with   Dizziness   Nausea    Karla Wolf is a 47 y.o. female.  HPI    47 year old female comes in with chief complaint of dizziness, nausea and intermittent episodes of abdominal pain.  Patient states that she had some left lower quadrant abdominal pain yesterday.  Pain was intermittent, and started after she was laughing.  The pain since then has been intermittent, will last only for few seconds.  She thinks she has about 5-10 episodes.  Last night she went to bed feeling nauseous after having dinner.  She woke up feeling fine, however around 11:00 in the morning the nausea returned.  Nausea has been constant, and with that she has intermittent episodes of dizziness.  Dizziness is described as spinning.  She denies any associated double vision, loss of vision, slurred speech, difficulty in swallowing, one-sided weakness or numbness.  Patient denies any history of heavy smoking, alcohol use and she does not have any medical problems.  She has had abdominal pain like this in the past, but has not seek medical attention for it.  Patient has history of fibroids that required resection.  She is sure that she is not pregnant at this time.  Review of system is negative for UTI-like symptoms, headache, neck pain, trauma, URI-like symptoms.  Patient however does state that she has noticed drainage of clear fluid from her left ear off and on for several months now.  Home Medications Prior to Admission medications   Medication Sig Start Date End Date Taking? Authorizing Provider  meclizine (ANTIVERT) 25 MG tablet Take 1 tablet (25 mg total) by mouth 3 (three) times daily as needed for dizziness. 06/07/22  Yes Corleone Biegler, MD  ondansetron (ZOFRAN-ODT) 8 MG  disintegrating tablet Take 1 tablet (8 mg total) by mouth every 8 (eight) hours as needed for nausea. 06/07/22  Yes Varney Biles, MD  Multiple Vitamin (MULTIVITAMIN ADULT PO) Take by mouth daily.    [provider]      Allergies    Patient has no known allergies.    Review of Systems   Review of Systems  Physical Exam Updated Vital Signs BP (!) 134/92   Pulse (!) 59   Temp 97.9 F (36.6 C) (Oral)   Resp 18   Wt 81 kg   SpO2 95%   BMI 28.82 kg/m  Physical Exam Vitals and nursing note reviewed.  Constitutional:      Appearance: She is well-developed.  HENT:     Head: Atraumatic.  Eyes:     Extraocular Movements: Extraocular movements intact.     Pupils: Pupils are equal, round, and reactive to light.     Comments: No nystagmus  Cardiovascular:     Rate and Rhythm: Normal rate.  Pulmonary:     Effort: Pulmonary effort is normal.  Musculoskeletal:     Cervical back: Normal range of motion and neck supple.  Skin:    General: Skin is warm and dry.  Neurological:     Mental Status: She is alert and oriented to person, place, and time.     Cranial Nerves: No cranial nerve deficit.     Sensory: No sensory deficit.     Motor: No weakness.     Coordination: Coordination normal.  ED Results / Procedures / Treatments   Labs (all labs ordered are listed, but only abnormal results are displayed) Labs Reviewed  CBC WITH DIFFERENTIAL/PLATELET - Abnormal; Notable for the following components:      Result Value   MCH 25.8 (*)    All other components within normal limits  COMPREHENSIVE METABOLIC PANEL - Abnormal; Notable for the following components:   Glucose, Bld 101 (*)    All other components within normal limits  URINALYSIS, ROUTINE W REFLEX MICROSCOPIC - Abnormal; Notable for the following components:   Hgb urine dipstick SMALL (*)    Nitrite POSITIVE (*)    Bacteria, UA FEW (*)    All other components within normal limits  LIPASE, BLOOD  I-STAT BETA  HCG BLOOD, ED ([facility], [facility], [facility] ONLY)    EKG None  Radiology No results found.  Procedures Procedures  {Document cardiac monitor, telemetry assessment procedure when appropriate:1}  Medications Ordered in ED Medications - No data to display  ED Course/ Medical Decision Making/ A&P   {   Click here for ABCD2, HEART and other calculatorsREFRESH Note before signing :1}                          Medical Decision Making Risk Prescription drug management.   This patient presents to the ED with chief complaint(s) of intermittent dizziness, nausea and intermittent abd pain with no concerning past medical history, family history or social history.The complaint involves an extensive differential diagnosis and also carries with it a high risk of complications and morbidity.    The differential diagnosis includes : Central vertigo:  Tumor  Stroke  ICH  Vertebrobasilar TIA  Peripheral Vertigo:  BPPV  Vestibular neuritis  Meniere disease  Migrainous vertigo  Ear Infection      The initial plan is to ***   Additional history obtained: Additional history obtained from {additional history:26846} Records reviewed {records:26847}  Independent labs interpretation:  The following labs were independently interpreted: ***  Independent visualization and interpretation of imaging: - I independently visualized the following imaging with scope of interpretation limited to determining acute life threatening conditions related to emergency care: ***, which revealed ***  Treatment and Reassessment: ***  Consultation: - Consulted or discussed management/test interpretation with external professional: ***  Consideration for admission or further workup:  Social Determinants of health:    Final Clinical Impression(s) / ED Diagnoses Final diagnoses:  Dizziness  Ear drainage, left  Left lower quadrant abdominal pain    Rx / DC Orders ED Discharge Orders          Ordered     meclizine (ANTIVERT) 25 MG tablet  3 times daily PRN        06/07/22 1737    ondansetron (ZOFRAN-ODT) 8 MG disintegrating tablet  Every 8 hours PRN        06/07/22 1737

## 2022-06-07 NOTE — Discharge Instructions (Addendum)
You are seen in the emergency room for dizziness, nausea, abdominal discomfort. Results in the emergency room are reassuring.  Take the medication prescribed to see if it helps you with the dizziness. Call ENT doctors given the persistent drainage from your ear.  Return to the emergency room if you start having constant dizziness with associated nausea, difficulty swallowing, difficulty with speech, double vision or loss of vision. Also return if you have worsening abdominal pain.

## 2022-06-07 NOTE — ED Triage Notes (Signed)
C/o dizziness with nausea that started after eating last night.

## 2022-06-07 NOTE — ED Provider Triage Note (Signed)
Emergency Medicine Provider Triage Evaluation Note  Karla Wolf , a 47 y.o. female  was evaluated in triage.  Pt complains of nausea and dizziness that started after eating chicken Alfredo last night.  She admits to a sharp left lower quadrant pain earlier in the day yesterday which quickly resolved.  She then developed nausea and dizziness after eating last night.  No chest pain or shortness of breath.  Denies emesis and diarrhea.  Review of Systems  Positive: nausea Negative: CP  Physical Exam  BP (!) 152/94   Pulse 65   Temp 98 F (36.7 C)   Resp 20   Wt 81 kg   SpO2 100%   BMI 28.82 kg/m  Gen:   Awake, no distress   Resp:  Normal effort  MSK:   Moves extremities without difficulty  Other:    Medical Decision Making  Medically screening exam initiated at 1:22 PM.  Appropriate orders placed.  Karla Wolf was informed that the remainder of the evaluation will be completed by another provider, this initial triage assessment does not replace that evaluation, and the importance of remaining in the ED until their evaluation is complete.  labs   Karla Wolf, [place] 06/07/22 1323

## 2022-10-14 ENCOUNTER — Emergency Department (HOSPITAL_COMMUNITY): Payer: No Typology Code available for payment source

## 2022-10-14 ENCOUNTER — Emergency Department (HOSPITAL_COMMUNITY)
Admission: EM | Admit: 2022-10-14 | Discharge: 2022-10-14 | Disposition: A | Discharge status: Home / Self Care | Payer: No Typology Code available for payment source | Attending: Emergency Medicine | Admitting: Emergency Medicine

## 2022-10-14 ENCOUNTER — Encounter (HOSPITAL_COMMUNITY): Payer: Self-pay

## 2022-10-14 ENCOUNTER — Other Ambulatory Visit: Payer: Self-pay

## 2022-10-14 DIAGNOSIS — U071 COVID-19: Secondary | ICD-10-CM | POA: Diagnosis not present

## 2022-10-14 DIAGNOSIS — R197 Diarrhea, unspecified: Secondary | ICD-10-CM | POA: Diagnosis not present

## 2022-10-14 DIAGNOSIS — R6883 Chills (without fever): Secondary | ICD-10-CM | POA: Diagnosis not present

## 2022-10-14 DIAGNOSIS — R051 Acute cough: Secondary | ICD-10-CM | POA: Diagnosis not present

## 2022-10-14 DIAGNOSIS — R059 Cough, unspecified: Secondary | ICD-10-CM | POA: Diagnosis not present

## 2022-10-14 DIAGNOSIS — J069 Acute upper respiratory infection, unspecified: Secondary | ICD-10-CM | POA: Diagnosis not present

## 2022-10-14 DIAGNOSIS — R000 Tachycardia, unspecified: Secondary | ICD-10-CM | POA: Diagnosis not present

## 2022-10-14 LAB — RESP PANEL BY RT-PCR (RSV, FLU A&B, COVID)  RVPGX2
Influenza A by PCR: NEGATIVE
Influenza B by PCR: NEGATIVE
Resp Syncytial Virus by PCR: NEGATIVE
SARS Coronavirus 2 by RT PCR: POSITIVE — AB

## 2022-10-14 MED ORDER — ONDANSETRON 4 MG PO TBDP: 4.0000 mg | ORAL_TABLET | Freq: Three times a day (TID) | ORAL | 0 refills | Status: AC | PRN

## 2022-10-14 MED ORDER — ACETAMINOPHEN 325 MG PO TABS
650.0000 mg | ORAL_TABLET | Freq: Once | ORAL | Status: AC
  Administered 2022-10-14: 650 mg via ORAL
  Filled 2022-10-14: qty 2

## 2022-10-14 MED ORDER — ONDANSETRON 4 MG PO TBDP
4.0000 mg | ORAL_TABLET | Freq: Once | ORAL | Status: AC
  Administered 2022-10-14: 4 mg via ORAL
  Filled 2022-10-14: qty 1

## 2022-10-14 NOTE — Discharge Instructions (Addendum)
Your history, exam, workup today revealed you have COVID-19 as the likely cause of your symptoms.  The x-ray showed no pneumonia and your vital signs are reassuring.  We agreed together to hold on more extensive lab and blood workup at this time.  We feel you are safe for discharge home but please use the nausea medicine to help maintain hydration and rest.  If any symptoms change or worsen acutely, please return to the nearest emergency department.

## 2022-10-14 NOTE — ED Triage Notes (Signed)
Pt. Arrives POV for a fever since Saturday. Pt. States that she has tried tylenol and mucinex and has not been able to break the fever. Pt. Has also had a cough, body aches, and loss of appetite.

## 2022-10-14 NOTE — ED Provider Notes (Signed)
[place] [facility]GENC[facility] DEPARTMENT AT [facility] Provider Note   CSN: [id number] Arrival date & time: 10/14/22  7829     History  Chief Complaint  Patient presents with   Fever    Karla Wolf is a 47 y.o. female.  The history is provided by medical records and the patient. No language interpreter was used.  Fever Temp source:  Subjective Severity:  Moderate Onset quality:  Gradual Duration:  3 days Timing:  Constant Progression:  Waxing and waning Chronicity:  New Relieved by:  Acetaminophen Worsened by:  Nothing Ineffective treatments:  None tried Associated symptoms: chills, cough, diarrhea, headaches (resolved), nausea and rhinorrhea   Associated symptoms: no chest pain, no confusion, no congestion, no dysuria, no rash, no somnolence, no sore throat and no vomiting        Home Medications Prior to Admission medications   Medication Sig Start Date End Date Taking? Authorizing Provider  meclizine (ANTIVERT) 25 MG tablet Take 1 tablet (25 mg total) by mouth 3 (three) times daily as needed for dizziness. 06/07/22   Derwood Kaplan, MD  Multiple Vitamin (MULTIVITAMIN ADULT PO) Take by mouth daily.    [provider]  ondansetron (ZOFRAN-ODT) 8 MG disintegrating tablet Take 1 tablet (8 mg total) by mouth every 8 (eight) hours as needed for nausea. 06/07/22   Derwood Kaplan, MD      Allergies    Patient has no known allergies.    Review of Systems   Review of Systems  Constitutional:  Positive for chills, fatigue and fever.  HENT:  Positive for rhinorrhea. Negative for congestion and sore throat.   Eyes:  Negative for visual disturbance.  Respiratory:  Positive for cough. Negative for chest tightness, shortness of breath and wheezing.   Cardiovascular:  Negative for chest pain, palpitations and leg swelling.  Gastrointestinal:  Positive for diarrhea and nausea. Negative for constipation and vomiting.  Genitourinary:  Negative for dysuria and flank  pain.  Musculoskeletal:  Negative for back pain, neck pain and neck stiffness.  Skin:  Negative for rash and wound.  Neurological:  Positive for headaches (resolved). Negative for weakness, light-headedness and numbness.  Psychiatric/Behavioral:  Negative for agitation and confusion.   All other systems reviewed and are negative.   Physical Exam Updated Vital Signs BP (!) 120/107 (BP Location: Left Arm)   Pulse (!) 103   Temp (!) 102.8 F (39.3 C) (Oral)   Resp 18   Ht 5' 6.75" (1.695 m)   Wt 83.9 kg   SpO2 97%   BMI 29.19 kg/m  Physical Exam Vitals and nursing note reviewed.  Constitutional:      General: She is not in acute distress.    Appearance: She is well-developed. She is not ill-appearing, toxic-appearing or diaphoretic.  HENT:     Head: Normocephalic and atraumatic.     Nose: Congestion and rhinorrhea present.     Mouth/Throat:     Mouth: Mucous membranes are moist.     Pharynx: No oropharyngeal exudate or posterior oropharyngeal erythema.  Eyes:     Extraocular Movements: Extraocular movements intact.     Conjunctiva/sclera: Conjunctivae normal.     Pupils: Pupils are equal, round, and reactive to light.  Cardiovascular:     Rate and Rhythm: Regular rhythm. Tachycardia present.     Heart sounds: No murmur heard. Pulmonary:     Effort: Pulmonary effort is normal. No respiratory distress.     Breath sounds: Normal breath sounds. No wheezing, rhonchi or  rales.  Chest:     Chest wall: No tenderness.  Abdominal:     General: Abdomen is flat.     Palpations: Abdomen is soft.     Tenderness: There is no abdominal tenderness. There is no right CVA tenderness, left CVA tenderness, guarding or rebound.  Musculoskeletal:        General: No swelling or tenderness.     Cervical back: Neck supple. No tenderness.     Right lower leg: No edema.     Left lower leg: No edema.  Skin:    General: Skin is warm and dry.     Capillary Refill: Capillary refill takes less than  2 seconds.     Findings: No erythema or rash.  Neurological:     Mental Status: She is alert.  Psychiatric:        Mood and Affect: Mood normal.     ED Results / Procedures / Treatments   Labs (all labs ordered are listed, but only abnormal results are displayed) Labs Reviewed  RESP PANEL BY RT-PCR (RSV, FLU A&B, COVID)  RVPGX2 - Abnormal; Notable for the following components:      Result Value   SARS Coronavirus 2 by RT PCR POSITIVE (*)    All other components within normal limits    EKG None  Radiology DG Chest Portable 1 View  Result Date: 10/14/2022 CLINICAL DATA:  Productive cough, chills, upper respiratory infection. EXAM: PORTABLE CHEST 1 VIEW COMPARISON:  Chest radiographs 08/05/2016 FINDINGS: Cardiac silhouette and mediastinal contours are within normal limits. The lungs are clear. No pleural effusion or pneumothorax. No acute skeletal abnormality. IMPRESSION: No active disease. Electronically Signed   By: Neita Garnet M.D.   On: 10/14/2022 08:24    Procedures Procedures    Medications Ordered in ED Medications  ondansetron (ZOFRAN-ODT) disintegrating tablet 4 mg (has no administration in time range)  acetaminophen (TYLENOL) tablet 650 mg (650 mg Oral Given 10/14/22 0802)    ED Course/ Medical Decision Making/ A&P                             Medical Decision Making Amount and/or Complexity of Data Reviewed Radiology: ordered.  Risk OTC drugs. Prescription drug management.    Karla Wolf is a 47 y.o. female with a past medical history significant for previous uterine fibroids who presents with fever and URI symptoms.  According to patient, for the last 3 days since Saturday, she has had fevers on and off.  She has had nausea but no vomiting.  She has had diarrhea with last time she eats.  She reports she has had productive cough and congestion and URI symptoms.  She reports a mild headache that has resolved.  She reports body aches.  She reports she did  have a sick contact with URI symptoms at work several days ago but did not have direct contact she thinks.  She reports no urinary changes or dysuria.  Denies any rashes or tick exposures.  Denies any trauma.  Reports she had a fever this morning and decided to come get evaluated.  She denies history of previous pneumonia or other significant infections.  On exam, lungs were clear and chest was nontender.  She was coughing.  She reports green sputum was coming up.  Abdomen was nontender there are good bowel sounds.  Back and flanks nontender.  No focal neurologic deficits.  No nuchal rigidity or neck stiffness.  No rashes seen.  Legs nontender nonedematous.  Intact pulses.  Mucous membranes are moist.  Clinically I suspect viral URI.  Given the patient's vital signs and his report of worsening productive cough will get chest x-ray and will also get a viral swab.  Will give her some nausea medicine and some Tylenol she did not take any antipyretics at this morning and her temperature is 102.8 here.  Given her otherwise well appearance, we agreed to hold on extensive lab testing or IV hydration as after some nausea medicine she will likely be able to orally hydrate.  Patient agrees with this.  If symptoms change will consider IV hydration and labs but will hold initially.  Patient says she is not pregnant.  Anticipate reassessment after workup.  9:53 AM X-ray revealed no pneumonia and vital signs remain reassuring.  Viral testing is positive for COVID infection.  As she is several days into her infection, we will hold on Paxlovid prescription will give her prescription for Zofran given the intermittent nausea.  She will rest and stay hydrated and follow-up with outpatient team.  She agrees with plan of care and was discharged in good condition with reassuring vital signs.         Final Clinical Impression(s) / ED Diagnoses Final diagnoses:  COVID-19  URI, acute  Acute cough  Diarrhea,  unspecified type    Rx / DC Orders ED Discharge Orders          Ordered    ondansetron (ZOFRAN-ODT) 4 MG disintegrating tablet  Every 8 hours PRN        10/14/22 1001           Clinical Impression: 1. COVID-19   2. URI, acute   3. Acute cough   4. Diarrhea, unspecified type     Disposition: Discharge  Condition: Good  I have discussed the results, Dx and Tx plan with the pt(& family if present). He/she/they expressed understanding and agree(s) with the plan. Discharge instructions discussed at great length. Strict return precautions discussed and pt &/or family have verbalized understanding of the instructions. No further questions at time of discharge.    New Prescriptions   ONDANSETRON (ZOFRAN-ODT) 4 MG DISINTEGRATING TABLET    Take 1 tablet (4 mg total) by mouth every 8 (eight) hours as needed for nausea or vomiting.    Follow Up: [facility] Emergency Department at [facility] [address] [id number] mc [address] [place] [address] [phone]    your PCP         Shameek Nyquist, Canary Brim, MD 10/14/22 1004

## 2023-09-25 DIAGNOSIS — B3731 Acute candidiasis of vulva and vagina: Secondary | ICD-10-CM | POA: Diagnosis not present

## 2023-09-25 DIAGNOSIS — N390 Urinary tract infection, site not specified: Secondary | ICD-10-CM | POA: Diagnosis not present

## 2023-11-18 DIAGNOSIS — Z01419 Encounter for gynecological examination (general) (routine) without abnormal findings: Secondary | ICD-10-CM | POA: Diagnosis not present

## 2023-11-18 DIAGNOSIS — Z1331 Encounter for screening for depression: Secondary | ICD-10-CM | POA: Diagnosis not present

## 2024-05-12 ENCOUNTER — Other Ambulatory Visit: Payer: Self-pay | Admitting: Physician Assistant

## 2024-05-12 DIAGNOSIS — R319 Hematuria, unspecified: Secondary | ICD-10-CM

## 2024-05-12 DIAGNOSIS — R1011 Right upper quadrant pain: Secondary | ICD-10-CM

## 2024-05-27 ENCOUNTER — Ambulatory Visit
Admission: RE | Admit: 2024-05-27 | Discharge: 2024-05-27 | Disposition: A | Discharge status: Home / Self Care | Source: Ambulatory Visit | Attending: Physician Assistant | Admitting: Physician Assistant

## 2024-05-27 DIAGNOSIS — R1011 Right upper quadrant pain: Secondary | ICD-10-CM

## 2024-05-27 DIAGNOSIS — R319 Hematuria, unspecified: Secondary | ICD-10-CM

## 2024-05-29 ENCOUNTER — Other Ambulatory Visit: Payer: Self-pay

## 2024-05-29 ENCOUNTER — Emergency Department (HOSPITAL_COMMUNITY)

## 2024-05-29 ENCOUNTER — Encounter (HOSPITAL_COMMUNITY): Payer: Self-pay | Admitting: *Deleted

## 2024-05-29 ENCOUNTER — Emergency Department (HOSPITAL_COMMUNITY)
Admission: EM | Admit: 2024-05-29 | Discharge: 2024-05-29 | Disposition: A | Discharge status: Home / Self Care | Attending: Emergency Medicine | Admitting: Emergency Medicine

## 2024-05-29 DIAGNOSIS — R011 Cardiac murmur, unspecified: Secondary | ICD-10-CM | POA: Diagnosis not present

## 2024-05-29 DIAGNOSIS — R002 Palpitations: Secondary | ICD-10-CM | POA: Insufficient documentation

## 2024-05-29 LAB — BASIC METABOLIC PANEL WITH GFR
Anion gap: 11 (ref 5–15)
BUN: 20 mg/dL (ref 6–20)
CO2: 25 mmol/L (ref 22–32)
Calcium: 8.5 mg/dL — ABNORMAL LOW (ref 8.9–10.3)
Chloride: 105 mmol/L (ref 98–111)
Creatinine, Ser: 1.06 mg/dL — ABNORMAL HIGH (ref 0.44–1.00)
GFR, Estimated: >60 mL/min
Glucose, Bld: 105 mg/dL — ABNORMAL HIGH (ref 70–99)
Potassium: 3.8 mmol/L (ref 3.5–5.1)
Sodium: 141 mmol/L (ref 135–145)

## 2024-05-29 LAB — CBC WITH DIFFERENTIAL/PLATELET
Abs Immature Granulocytes: 0.03 10*3/uL (ref 0.00–0.07)
Basophils Absolute: 0 10*3/uL (ref 0.0–0.1)
Basophils Relative: 0 %
Eosinophils Absolute: 0.1 10*3/uL (ref 0.0–0.5)
Eosinophils Relative: 2 %
HCT: 38.1 % (ref 36.0–46.0)
Hemoglobin: 12.4 g/dL (ref 12.0–15.0)
Immature Granulocytes: 0 %
Lymphocytes Relative: 37 %
Lymphs Abs: 3.3 10*3/uL (ref 0.7–4.0)
MCH: 25.9 pg — ABNORMAL LOW (ref 26.0–34.0)
MCHC: 32.5 g/dL (ref 30.0–36.0)
MCV: 79.5 fL — ABNORMAL LOW (ref 80.0–100.0)
Monocytes Absolute: 0.4 10*3/uL (ref 0.1–1.0)
Monocytes Relative: 5 %
Neutro Abs: 4.9 10*3/uL (ref 1.7–7.7)
Neutrophils Relative %: 56 %
Platelets: 374 10*3/uL (ref 150–400)
RBC: 4.79 MIL/uL (ref 3.87–5.11)
RDW: 14.2 % (ref 11.5–15.5)
WBC: 8.8 10*3/uL (ref 4.0–10.5)
nRBC: 0 % (ref 0.0–0.2)

## 2024-05-29 LAB — URINALYSIS, ROUTINE W REFLEX MICROSCOPIC
Bacteria, UA: NONE SEEN
Bilirubin Urine: NEGATIVE
Glucose, UA: NEGATIVE mg/dL
Ketones, ur: NEGATIVE mg/dL
Leukocytes,Ua: NEGATIVE
Nitrite: NEGATIVE
Protein, ur: NEGATIVE mg/dL
Specific Gravity, Urine: 1.023 (ref 1.005–1.030)
pH: 5 (ref 5.0–8.0)

## 2024-05-29 LAB — TROPONIN T, HIGH SENSITIVITY
Troponin T High Sensitivity: <6 ng/L (ref 0–19)
Troponin T High Sensitivity: 6 ng/L (ref 0–19)

## 2024-05-29 LAB — MAGNESIUM: Magnesium: 2 mg/dL (ref 1.7–2.4)

## 2024-05-29 MED ORDER — SODIUM CHLORIDE 0.9 % IV BOLUS
1000.0000 mL | Freq: Once | INTRAVENOUS | Status: AC
  Administered 2024-05-29: 1000 mL via INTRAVENOUS

## 2024-05-29 NOTE — ED Provider Notes (Signed)
 " [place] EMERGENCY DEPARTMENT AT [facility] Provider Note   CSN: [id number] Arrival date & time: 05/29/24  1539     Patient presents with: abnormal lab, potassium high   Karla Wolf is a 49 y.o. female.   HPI 49 year old female presents for evaluation of high potassium.  She states that she has been dealing with on and off flank pain, left greater than right, for months.  While being worked up with her PCP they found her to have hematuria so she went to urology.  She had a urine yesterday that did not show infection.  However when they drew blood work, they found high potassium and called her with the results today.  They told her that if she had any palpitations she needed to go to the ER.  Around 1 hour prior to arrival she developed transient palpitations.  She has had these before, and today lasted a couple minutes with a chest pain that felt like a burning and shocking sensation.  This is similar to other palpitation she has had intermittently in the past.  She did not have any shortness of breath, radiation of pain, etc.  Right now she has no flank pain, urinary symptoms, and is asymptomatic.  Prior to Admission medications  Medication Sig Start Date End Date Taking? Authorizing Provider  meclizine  (ANTIVERT ) 25 MG tablet Take 1 tablet (25 mg total) by mouth 3 (three) times daily as needed for dizziness. 06/07/22   Charlyn Sora, MD  Multiple Vitamin (MULTIVITAMIN ADULT PO) Take by mouth daily.    [provider]  ondansetron  (ZOFRAN -ODT) 4 MG disintegrating tablet Take 1 tablet (4 mg total) by mouth every 8 (eight) hours as needed for nausea or vomiting. 10/14/22   Tegeler, Lonni PARAS, MD  ondansetron  (ZOFRAN -ODT) 8 MG disintegrating tablet Take 1 tablet (8 mg total) by mouth every 8 (eight) hours as needed for nausea. 06/07/22   Charlyn Sora, MD    Allergies: Patient has no known allergies.    Review of Systems  Respiratory:  Negative for shortness of  breath.   Cardiovascular:  Positive for chest pain and palpitations.    Updated Vital Signs BP (!) 139/95   Pulse 70   Temp 98.2 F (36.8 C)   Resp 18   Wt 89.8 kg   LMP  (LMP Unknown)   SpO2 99%   BMI 31.24 kg/m   Physical Exam Vitals and nursing note reviewed.  Constitutional:      General: She is not in acute distress.    Appearance: She is well-developed. She is not ill-appearing or diaphoretic.  HENT:     Head: Normocephalic and atraumatic.  Cardiovascular:     Rate and Rhythm: Normal rate and regular rhythm.     Heart sounds: Murmur (has known murmur) heard.  Pulmonary:     Effort: Pulmonary effort is normal.     Breath sounds: Normal breath sounds.  Abdominal:     General: There is no distension.     Palpations: Abdomen is soft.     Tenderness: There is no abdominal tenderness. There is no right CVA tenderness or left CVA tenderness.  Skin:    General: Skin is warm and dry.  Neurological:     Mental Status: She is alert.     (all labs ordered are listed, but only abnormal results are displayed) Labs Reviewed  BASIC METABOLIC PANEL WITH GFR - Abnormal; Notable for the following components:      Result Value  Glucose, Bld 105 (*)    Creatinine, Ser 1.06 (*)    Calcium 8.5 (*)    All other components within normal limits  CBC WITH DIFFERENTIAL/PLATELET - Abnormal; Notable for the following components:   MCV 79.5 (*)    MCH 25.9 (*)    All other components within normal limits  URINALYSIS, ROUTINE W REFLEX MICROSCOPIC - Abnormal; Notable for the following components:   APPearance HAZY (*)    Hgb urine dipstick MODERATE (*)    All other components within normal limits  MAGNESIUM  TROPONIN T, HIGH SENSITIVITY  TROPONIN T, HIGH SENSITIVITY    EKG: EKG Interpretation Date/Time:  Saturday May 29 2024 15:59:30 EST Ventricular Rate:  77 PR Interval:  174 QRS Duration:  89 QT Interval:  381 QTC Calculation: 432 R Axis:   59  Text  Interpretation: Sinus rhythm Low voltage, precordial leads Borderline T wave abnormalities no significant change since 2021 Confirmed by Freddi Hamilton [phone]) on 05/29/2024 4:16:01 PM  Radiology: [facility] Chest 2 View Result Date: 05/29/2024 EXAM: 2 VIEW(S) XRAY OF THE CHEST 05/29/2024 04:20:00 PM COMPARISON: Comparison with 10/14/2022. CLINICAL HISTORY: Chest pain. FINDINGS: LUNGS AND PLEURA: Lungs are clear. No pleural effusion or pneumothorax. HEART AND MEDIASTINUM: Heart size and pulmonary vascularity are normal. Mediastinal contours appear intact. No acute abnormality of the cardiac and mediastinal silhouettes. No significant changes since previous study. BONES AND SOFT TISSUES: No acute osseous abnormality. IMPRESSION: 1. No acute cardiopulmonary abnormality. 2. No significant change since the prior study. Electronically signed by: Elsie Gravely MD 05/29/2024 04:31 PM EST RP Workstation: [id number]     Procedures   Medications Ordered in the ED  sodium chloride  0.9 % bolus 1,000 mL (0 mLs Intravenous Stopped 05/29/24 2049)                                    Medical Decision Making Amount and/or Complexity of Data Reviewed External Data Reviewed: notes. Labs: ordered.    Details: Normal troponins x 2.  Normal potassium Radiology: ordered and independent interpretation performed.    Details: No CHF ECG/medicine tests: ordered and independent interpretation performed.    Details: No ischemia   Patient presents with a chief complaints of an abnormal potassium on outpatient testing.  Given she stated that it was elevated and is normal here I suspect she had hemolysis.  Her renal function is just slightly abnormal.  She was given some fluids.  Given she did have some transient chest pain (though it sounds very atypical and was likely more palpitations) troponins were sent and are negative.  X-ray unremarkable.  Low suspicion for ACS, PE, etc.  She has been having chronic flank pain for months  but does not have any now.  I do not think an emergent CT is needed as she has one ordered as an outpatient in less than a week.  She appears stable for discharge home at this time.  Will discharge home with return precautions.     Final diagnoses:  Palpitations    ED Discharge Orders     None          Freddi Hamilton, MD 05/29/24 2231  "

## 2024-05-29 NOTE — Discharge Instructions (Signed)
 If you develop recurrent, continued, or worsening chest pain, shortness of breath, fever, vomiting, abdominal or back pain, or any other new/concerning symptoms then return to the ER for evaluation.

## 2024-05-29 NOTE — ED Triage Notes (Signed)
 Here by POV from home for abnormal lab. Instructed to come to ED by PCP for high potassium. Mentions some palpitations ongoing for ~ 1 week. Lab drawn yesterday. Denies sx other than palpitations.
